# Patient Record
Sex: Female | Born: 1980 | Race: White | Hispanic: No | Marital: Single | Smoking: Former smoker
Health system: Southern US, Community
[De-identification: ages and names within clinical notes are randomized; demographics above are authoritative.]

## PROBLEM LIST (undated history)

## (undated) HISTORY — PX: OOPHORECTOMY: SHX6387

## (undated) HISTORY — PX: KNEE SURGERY: SHX244

---

## 2008-07-04 ENCOUNTER — Ambulatory Visit: Payer: Self-pay | Admitting: Family Medicine

## 2009-01-17 ENCOUNTER — Ambulatory Visit: Payer: Self-pay

## 2009-07-09 ENCOUNTER — Observation Stay: Payer: Self-pay

## 2009-09-10 ENCOUNTER — Observation Stay: Payer: Self-pay | Admitting: Obstetrics and Gynecology

## 2009-09-13 ENCOUNTER — Inpatient Hospital Stay: Payer: Self-pay | Admitting: Obstetrics and Gynecology

## 2009-11-16 ENCOUNTER — Ambulatory Visit: Payer: Self-pay | Admitting: Obstetrics & Gynecology

## 2009-11-28 ENCOUNTER — Emergency Department: Payer: Self-pay | Admitting: Emergency Medicine

## 2009-12-07 ENCOUNTER — Ambulatory Visit: Payer: Self-pay | Admitting: Obstetrics & Gynecology

## 2011-07-01 ENCOUNTER — Emergency Department: Payer: Self-pay | Admitting: Emergency Medicine

## 2012-06-17 ENCOUNTER — Ambulatory Visit: Payer: Self-pay | Admitting: Obstetrics & Gynecology

## 2012-06-17 LAB — CBC WITH DIFFERENTIAL/PLATELET
Basophil %: 0.3 %
Eosinophil %: 2 %
HCT: 32.3 % — ABNORMAL LOW (ref 35.0–47.0)
Lymphocyte %: 18.1 %
MCHC: 33.6 g/dL (ref 32.0–36.0)
Monocyte %: 6.3 %
Neutrophil #: 8.4 10*3/uL — ABNORMAL HIGH (ref 1.4–6.5)
Platelet: 272 10*3/uL (ref 150–440)
WBC: 11.4 10*3/uL — ABNORMAL HIGH (ref 3.6–11.0)

## 2012-06-18 ENCOUNTER — Inpatient Hospital Stay: Payer: Self-pay | Admitting: Obstetrics & Gynecology

## 2012-07-27 LAB — PATHOLOGY REPORT

## 2014-05-19 ENCOUNTER — Emergency Department: Payer: Self-pay | Admitting: Emergency Medicine

## 2014-05-21 ENCOUNTER — Encounter (HOSPITAL_COMMUNITY): Payer: Self-pay | Admitting: Emergency Medicine

## 2014-05-21 ENCOUNTER — Emergency Department (HOSPITAL_COMMUNITY)
Admission: EM | Admit: 2014-05-21 | Discharge: 2014-05-21 | Disposition: A | Discharge status: Home / Self Care | Payer: No Typology Code available for payment source | Attending: Emergency Medicine | Admitting: Emergency Medicine

## 2014-05-21 ENCOUNTER — Emergency Department (HOSPITAL_COMMUNITY): Payer: No Typology Code available for payment source

## 2014-05-21 DIAGNOSIS — Z9889 Other specified postprocedural states: Secondary | ICD-10-CM | POA: Diagnosis not present

## 2014-05-21 DIAGNOSIS — M791 Myalgia, unspecified site: Secondary | ICD-10-CM

## 2014-05-21 DIAGNOSIS — M25519 Pain in unspecified shoulder: Secondary | ICD-10-CM | POA: Diagnosis not present

## 2014-05-21 DIAGNOSIS — M25569 Pain in unspecified knee: Secondary | ICD-10-CM | POA: Insufficient documentation

## 2014-05-21 DIAGNOSIS — G8911 Acute pain due to trauma: Secondary | ICD-10-CM | POA: Insufficient documentation

## 2014-05-21 DIAGNOSIS — T148XXA Other injury of unspecified body region, initial encounter: Secondary | ICD-10-CM

## 2014-05-21 DIAGNOSIS — M542 Cervicalgia: Secondary | ICD-10-CM | POA: Insufficient documentation

## 2014-05-21 MED ORDER — OXYCODONE-ACETAMINOPHEN 5-325 MG PO TABS: 1.0000 | ORAL_TABLET | ORAL | Status: DC | PRN

## 2014-05-21 MED ORDER — OXYCODONE-ACETAMINOPHEN 5-325 MG PO TABS
2.0000 | ORAL_TABLET | Freq: Once | ORAL | Status: AC
  Administered 2014-05-21: 2 via ORAL
  Filled 2014-05-21: qty 2
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
## 2014-05-21 NOTE — ED Notes (Signed)
Pt reports a knot to collar bone area with limited movement  Rt arm. Pt unable to lift Rt arm above shoulder level.
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
## 2014-05-24 NOTE — ED Provider Notes (Signed)
Medical screening examination/treatment/procedure(s) were performed by non-physician practitioner and as supervising physician I was immediately available for consultation/collaboration.   EKG Interpretation None       Jaedyn Marrufo, MD 05/24/14 1117 

## 2015-01-03 NOTE — Op Note (Signed)
PATIENT NAME:  Misty Lyons, Misty Lyons MR#:  [id number] DATE OF BIRTH:  Apr 13, 1981  DATE OF PROCEDURE:  06/18/2012  PREOPERATIVE DIAGNOSES:  1. Term intrauterine pregnancy, prior cesarean section.  2. Desire for permanent sterility.   POSTOPERATIVE DIAGNOSES:   1. Term intrauterine pregnancy, prior cesarean section.  2. Desire for permanent sterility.   PROCEDURE PERFORMED:  1. Low transverse cesarean section. 2. Bilateral tubal ligation.   SURGEON: Dierdre Searles, MD   ASSISTANT: Elliot Gurney, MD   ANESTHESIA: Spinal.   ESTIMATED BLOOD LOSS: 250 mL.   COMPLICATIONS: None.   FINDINGS: Normal tubes, ovaries, and uterus with minimal adhesions. The patient has delivery of a viable female infant, named Arnetha Massy, with a weight of 7 pounds, 13 ounces and Apgar scores of 9 and 9 at one and five minutes, respectively.   DISPOSITION: To the recovery room in stable condition.   TECHNIQUE: The patient is prepped and draped in the usual sterile fashion after adequate anesthesia is obtained in the supine position on the Operating Room table. A scalpel was used to create a low transverse skin incision through the area of a prior scar down to the level of the rectus fascia. The rectus fascia is then dissected bilaterally using Mayo scissors. The rectus muscles are separated from the rectus fascia as well as the midline. The peritoneum is penetrated and the serosa over the lower uterine segment is dissected with inferior retraction of the bladder. A scalpel is then used to create a low transverse hysterotomy incision that is then extended by blunt dissection. Amniotomy then reveals clear fluid. The infant's head is delivered without the use of a suction device, and the oropharynx is suctioned. The remaining portion of the infant is delivered with clamping and cutting of the umbilical cord.   Umbilical blood is obtained and the placenta is then manually extracted. The uterus is externalized and cleansed of all  membranes and debris using a moist sponge. The hysterotomy incision is closed with a running 1 Vicryl suture in a locking fashion with excellent hemostasis and closure noted.   The right fallopian tube is grasped in the midportion with a Babcock clamp, and a loop is tied with 2 Vicryl sutures, excised and cauterized. There is no ovary observed on the right side. The left fallopian tube is grasped in the midportion with a Babcock clamp, and a loop is tied with 2 Vicryl sutures, excised, and cauterized. The left ovary is normal on this side.   The uterus is then placed back in the intra-abdominal cavity, and the paracolic colic gutters are irrigated with warm saline. Re-examination of the tubal incisions and the hysterotomy incision reveals excellent hemostasis. Interceed is placed over the uterine incision. The peritoneum is closed with a Vicryl suture.   The On-Q pain pump device is placed. A Kocher is used to attempt to pull up on the rectus fascia. Two trocars were placed through the abdomen into the subfascial space, and then the Silver soaker catheters are threaded into place through these trocars into the subfascial space. The fascia is closed with 0 Maxon suture with careful placement of suture not to incorporate the catheters. Subcutaneous tissues are irrigated and hemostasis is assured using electrocautery. The skin is closed with dissolvable staples and Dermabond. The catheters are flushed with 5 mL each of bupivacaine 0.5%. The catheters are stabilized in place with Steri-Strips and a Tegaderm bandage. The patient goes to the recovery room in stable condition. All sponge, instrument, and needle  counts are correct. ____________________________ R. Annamarie MajorPaul Maryse Brierley, MD rph:cbb D: 06/18/2012 10:10:16 ET T: 06/18/2012 10:46:23 ET JOB#: [id number]  cc: Dierdre Searles. Paul Shaquna Geigle, MD, <Dictator> Nadara MustardOBERT P Rayen Dafoe MD ELECTRONICALLY SIGNED 06/23/2012 8:40
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
## 2015-11-30 ENCOUNTER — Emergency Department: Payer: Self-pay

## 2015-11-30 ENCOUNTER — Emergency Department
Admission: EM | Admit: 2015-11-30 | Discharge: 2015-11-30 | Disposition: A | Discharge status: Home / Self Care | Payer: Self-pay | Attending: Emergency Medicine | Admitting: Emergency Medicine

## 2015-11-30 ENCOUNTER — Encounter: Payer: Self-pay | Admitting: Emergency Medicine

## 2015-11-30 DIAGNOSIS — Y998 Other external cause status: Secondary | ICD-10-CM | POA: Insufficient documentation

## 2015-11-30 DIAGNOSIS — Y9289 Other specified places as the place of occurrence of the external cause: Secondary | ICD-10-CM | POA: Insufficient documentation

## 2015-11-30 DIAGNOSIS — S93401A Sprain of unspecified ligament of right ankle, initial encounter: Secondary | ICD-10-CM | POA: Insufficient documentation

## 2015-11-30 DIAGNOSIS — Y9389 Activity, other specified: Secondary | ICD-10-CM | POA: Insufficient documentation

## 2015-11-30 DIAGNOSIS — X501XXA Overexertion from prolonged static or awkward postures, initial encounter: Secondary | ICD-10-CM | POA: Insufficient documentation

## 2015-11-30 DIAGNOSIS — F17200 Nicotine dependence, unspecified, uncomplicated: Secondary | ICD-10-CM | POA: Insufficient documentation

## 2015-11-30 MED ORDER — HYDROCODONE-ACETAMINOPHEN 5-325 MG PO TABS: 1.0000 | ORAL_TABLET | ORAL | Status: DC | PRN

## 2015-11-30 MED ORDER — IBUPROFEN 800 MG PO TABS: 800.0000 mg | ORAL_TABLET | Freq: Three times a day (TID) | ORAL | Status: DC | PRN

## 2015-11-30 NOTE — Discharge Instructions (Signed)
Acute Ankle Sprain With Phase I Rehab An acute ankle sprain is a partial or complete tear in one or more of the ligaments of the ankle due to traumatic injury. The severity of the injury depends on both the number of ligaments sprained and the grade of sprain. There are 3 grades of sprains.   A grade 1 sprain is a mild sprain. There is a slight pull without obvious tearing. There is no loss of strength, and the muscle and ligament are the correct length.  A grade 2 sprain is a moderate sprain. There is tearing of fibers within the substance of the ligament where it connects two bones or two cartilages. The length of the ligament is increased, and there is usually decreased strength.  A grade 3 sprain is a complete rupture of the ligament and is uncommon. In addition to the grade of sprain, there are three types of ankle sprains.  Lateral ankle sprains: This is a sprain of one or more of the three ligaments on the outer side (lateral) of the ankle. These are the most common sprains. Medial ankle sprains: There is one large triangular ligament of the inner side (medial) of the ankle that is susceptible to injury. Medial ankle sprains are less common. Syndesmosis, "high ankle," sprains: The syndesmosis is the ligament that connects the two bones of the lower leg. Syndesmosis sprains usually only occur with very severe ankle sprains. SYMPTOMS  Pain, tenderness, and swelling in the ankle, starting at the side of injury that may progress to the whole ankle and foot with time.  "Pop" or tearing sensation at the time of injury.  Bruising that may spread to the heel.  Impaired ability to walk soon after injury. CAUSES   Acute ankle sprains are caused by trauma placed on the ankle that temporarily forces or pries the anklebone (talus) out of its normal socket.  Stretching or tearing of the ligaments that normally hold the joint in place (usually due to a twisting injury). RISK INCREASES  WITH:  Previous ankle sprain.  Sports in which the foot may land awkwardly (i.e., basketball, volleyball, or soccer) or walking or running on uneven or rough surfaces.  Shoes with inadequate support to prevent sideways motion when stress occurs.  Poor strength and flexibility.  Poor balance skills.  Contact sports. PREVENTION   Warm up and stretch properly before activity.  Maintain physical fitness:  Ankle and leg flexibility, muscle strength, and endurance.  Cardiovascular fitness.  Balance training activities.  Use proper technique and have a coach correct improper technique.  Taping, protective strapping, bracing, or high-top tennis shoes may help prevent injury. Initially, tape is best; however, it loses most of its support function within 10 to 15 minutes.  Wear proper-fitted protective shoes (High-top shoes with taping or bracing is more effective than either alone).  Provide the ankle with support during sports and practice activities for 12 months following injury. PROGNOSIS   If treated properly, ankle sprains can be expected to recover completely; however, the length of recovery depends on the degree of injury.  A grade 1 sprain usually heals enough in 5 to 7 days to allow modified activity and requires an average of 6 weeks to heal completely.  A grade 2 sprain requires 6 to 10 weeks to heal completely.  A grade 3 sprain requires 12 to 16 weeks to heal.  A syndesmosis sprain often takes more than 3 months to heal. RELATED COMPLICATIONS   Frequent recurrence of symptoms may   result in a chronic problem. Appropriately addressing the problem the first time decreases the frequency of recurrence and optimizes healing time. Severity of the initial sprain does not predict the likelihood of later instability. °· Injury to other structures (bone, cartilage, or tendon). °· A chronically unstable or arthritic ankle joint is a possibility with repeated  sprains. °TREATMENT °Treatment initially involves the use of ice, medication, and compression bandages to help reduce pain and inflammation. Ankle sprains are usually immobilized in a walking cast or boot to allow for healing. Crutches may be recommended to reduce pressure on the injury. After immobilization, strengthening and stretching exercises may be necessary to regain strength and a full range of motion. Surgery is rarely needed to treat ankle sprains. °MEDICATION  °· Nonsteroidal anti-inflammatory medications, such as aspirin and ibuprofen (do not take for the first 3 days after injury or within 7 days before surgery), or other minor pain relievers, such as acetaminophen, are often recommended. Take these as directed by your caregiver. Contact your caregiver immediately if any bleeding, stomach upset, or signs of an allergic reaction occur from these medications. °· Ointments applied to the skin may be helpful. °· Pain relievers may be prescribed as necessary by your caregiver. Do not take prescription pain medication for longer than 4 to 7 days. Use only as directed and only as much as you need. °HEAT AND COLD °· Cold treatment (icing) is used to relieve pain and reduce inflammation for acute and chronic cases. Cold should be applied for 10 to 15 minutes every 2 to 3 hours for inflammation and pain and immediately after any activity that aggravates your symptoms. Use ice packs or an ice massage. °· Heat treatment may be used before performing stretching and strengthening activities prescribed by your caregiver. Use a heat pack or a warm soak. °SEEK IMMEDIATE MEDICAL CARE IF:  °· Pain, swelling, or bruising worsens despite treatment. °· You experience pain, numbness, discoloration, or coldness in the foot or toes. °· New, unexplained symptoms develop (drugs used in treatment may produce side effects.) °EXERCISES  °PHASE I EXERCISES °RANGE OF MOTION (ROM) AND STRETCHING EXERCISES - Ankle Sprain, Acute Phase I,  Weeks 1 to 2 °These exercises may help you when beginning to restore flexibility in your ankle. You will likely work on these exercises for the 1 to 2 weeks after your injury. Once your physician, physical therapist, or athletic trainer sees adequate progress, he or she will advance your exercises. While completing these exercises, remember:  °· Restoring tissue flexibility helps normal motion to return to the joints. This allows healthier, less painful movement and activity. °· An effective stretch should be held for at least 30 seconds. °· A stretch should never be painful. You should only feel a gentle lengthening or release in the stretched tissue. °RANGE OF MOTION - Dorsi/Plantar Flexion °· While sitting with your right / left knee straight, draw the top of your foot upwards by flexing your ankle. Then reverse the motion, pointing your toes downward. °· Hold each position for __________ seconds. °· After completing your first set of exercises, repeat this exercise with your knee bent. °Repeat __________ times. Complete this exercise __________ times per day.  °RANGE OF MOTION - Ankle Alphabet °· Imagine your right / left big toe is a pen. °· Keeping your hip and knee still, write out the entire alphabet with your "pen." Make the letters as large as you can without increasing any discomfort. °Repeat __________ times. Complete this exercise __________   times per day.  °STRENGTHENING EXERCISES - Ankle Sprain, Acute -Phase I, Weeks 1 to 2 °These exercises may help you when beginning to restore strength in your ankle. You will likely work on these exercises for 1 to 2 weeks after your injury. Once your physician, physical therapist, or athletic trainer sees adequate progress, he or she will advance your exercises. While completing these exercises, remember:  °· Muscles can gain both the endurance and the strength needed for everyday activities through controlled exercises. °· Complete these exercises as instructed by  your physician, physical therapist, or athletic trainer. Progress the resistance and repetitions only as guided. °· You may experience muscle soreness or fatigue, but the pain or discomfort you are trying to eliminate should never worsen during these exercises. If this pain does worsen, stop and make certain you are following the directions exactly. If the pain is still present after adjustments, discontinue the exercise until you can discuss the trouble with your clinician. °STRENGTH - Dorsiflexors °· Secure a rubber exercise band/tubing to a fixed object (i.e., table, pole) and loop the other end around your right / left foot. °· Sit on the floor facing the fixed object. The band/tubing should be slightly tense when your foot is relaxed. °· Slowly draw your foot back toward you using your ankle and toes. °· Hold this position for __________ seconds. Slowly release the tension in the band and return your foot to the starting position. °Repeat __________ times. Complete this exercise __________ times per day.  °STRENGTH - Plantar-flexors  °· Sit with your right / left leg extended. Holding onto both ends of a rubber exercise band/tubing, loop it around the ball of your foot. Keep a slight tension in the band. °· Slowly push your toes away from you, pointing them downward. °· Hold this position for __________ seconds. Return slowly, controlling the tension in the band/tubing. °Repeat __________ times. Complete this exercise __________ times per day.  °STRENGTH - Ankle Eversion °· Secure one end of a rubber exercise band/tubing to a fixed object (table, pole). Loop the other end around your foot just before your toes. °· Place your fists between your knees. This will focus your strengthening at your ankle. °· Drawing the band/tubing across your opposite foot, slowly, pull your little toe out and up. Make sure the band/tubing is positioned to resist the entire motion. °· Hold this position for __________ seconds. °Have  your muscles resist the band/tubing as it slowly pulls your foot back to the starting position.  °Repeat __________ times. Complete this exercise __________ times per day.  °STRENGTH - Ankle Inversion °· Secure one end of a rubber exercise band/tubing to a fixed object (table, pole). Loop the other end around your foot just before your toes. °· Place your fists between your knees. This will focus your strengthening at your ankle. °· Slowly, pull your big toe up and in, making sure the band/tubing is positioned to resist the entire motion. °· Hold this position for __________ seconds. °· Have your muscles resist the band/tubing as it slowly pulls your foot back to the starting position. °Repeat __________ times. Complete this exercises __________ times per day.  °STRENGTH - Towel Curls °· Sit in a chair positioned on a non-carpeted surface. °· Place your right / left foot on a towel, keeping your heel on the floor. °· Pull the towel toward your heel by only curling your toes. Keep your heel on the floor. °· If instructed by your physician, physical therapist,   or athletic trainer, add weight to the end of the towel. Repeat __________ times. Complete this exercise __________ times per day.   This information is not intended to replace advice given to you by your health care provider. Make sure you discuss any questions you have with your health care provider.   Document Released: 04/03/2005 Document Revised: 09/23/2014 Document Reviewed: 12/15/2008 Elsevier Interactive Patient Education 2016 [employer]   Continue Ace wrap for support. Use ibuprofen and hydrocodone as needed for pain control. Follow-up with the orthopedist if not improving. Begin exercises as pain improves.

## 2015-11-30 NOTE — ED Notes (Signed)
States she twisted her right ankle on Monday ..swelling with bruising noted to lateral aspect of ankle

## 2015-11-30 NOTE — ED Notes (Signed)
States while walking Monday morning patient "rolled right ankle over".  C/O worsening pain and swelling since injury.

## 2015-11-30 NOTE — ED Provider Notes (Signed)
[facility] Emergency Department Provider Note  ____________________________________________  Time seen: Approximately 3:38 PM  I have reviewed the triage vital signs and the nursing notes.   HISTORY  Chief Complaint Ankle Pain    HPI Misty Lyons is a 35 y.o. female who injured her ankle about 4 days ago. She has a history of ankle sprain. She is able to ambulate. She's had swelling and bruising.   History reviewed. No pertinent past medical history.  There are no active problems to display for this patient.   Past Surgical History  Procedure Laterality Date  . Knee surgery      RT Knee  . Cesarean section       HX of 2 sections  . Oophorectomy      Current Outpatient Rx  Name  Route  Sig  Dispense  Refill  . HYDROcodone-acetaminophen (NORCO) 5-325 MG tablet   Oral   Take 1 tablet by mouth every 4 (four) hours as needed for moderate pain.   12 tablet   0   . ibuprofen (ADVIL,MOTRIN) 800 MG tablet   Oral   Take 1 tablet (800 mg total) by mouth every 8 (eight) hours as needed.   15 tablet   0   . oxyCODONE-acetaminophen (PERCOCET/ROXICET) 5-325 MG per tablet   Oral   Take 1-2 tablets by mouth every 4 (four) hours as needed for severe pain.   15 tablet   0     Allergies Sulfa antibiotics  No family history on file.  Social History Social History  Substance Use Topics  . Smoking status: Current Some Day Smoker -- 0.50 packs/day  . Smokeless tobacco: Never Used  . Alcohol Use: Yes     Comment: social    Review of Systems Constitutional: No fever/chills Eyes: No visual changes. ENT: No sore throat. Cardiovascular: Denies chest pain. Respiratory: Denies shortness of breath. Gastrointestinal: No abdominal pain.  No nausea, no vomiting.  No diarrhea.  No constipation. Genitourinary: Negative for dysuria. Musculoskeletal: Negative for back pain. Skin: Negative for rash. Neurological: Negative for headaches, focal weakness  or numbness. 10-point ROS otherwise negative.  ____________________________________________   PHYSICAL EXAM:  VITAL SIGNS: ED Triage Vitals  Enc Vitals Group     BP 11/30/15 1126 123/60 mmHg     Pulse Rate 11/30/15 1126 85     Resp 11/30/15 1126 16     Temp 11/30/15 1126 98.1 F (36.7 C)     Temp Source 11/30/15 1126 Oral     SpO2 --      Weight 11/30/15 1126 230 lb (104.327 kg)     Height 11/30/15 1126 5\' 4"  (1.626 m)     Head Cir --      Peak Flow --      Pain Score 11/30/15 1126 6     Pain Loc --      Pain Edu? --      Excl. in GC? --     Constitutional: Alert and oriented. Well appearing and in no acute distress. Eyes: Conjunctivae are normal. PERRL. EOMI. Neck:  Supple.  No adenopathy.   Cardiovascular: Normal rate, regular rhythm. Grossly normal heart sounds.  Respiratory: Normal respiratory effort.  No retractions. Lungs CTAB. Marland Kitchen. Musculoskeletal: Nml ROM of upper and lower extremity joints.Right ankle and tender over the lateral malleoli and surrounding ligaments with ecchymosis and swelling. Minimal laxity. Neurologic:  Normal speech and language. No gross focal neurologic deficits are appreciated. No gait instability. Skin:  Skin  is warm, dry and intact. No rash noted. Psychiatric: Mood and affect are normal. Speech and behavior are normal.  ____________________________________________   LABS (all labs ordered are listed, but only abnormal results are displayed)  Labs Reviewed - No data to display ____________________________________________  EKG   ____________________________________________  RADIOLOGY  CLINICAL DATA: Pain following twisting injury 3 days prior  EXAM: RIGHT ANKLE - COMPLETE 3+ VIEW  COMPARISON: None.  FINDINGS: Frontal, oblique, and lateral views were obtained. There is soft tissue swelling laterally. There is no demonstrable fracture or joint effusion. The ankle mortise appears intact. No appreciable joint space  narrowing.  IMPRESSION: Soft tissue swelling laterally. No fracture evident. Mortise appears intact.   Electronically Signed  By: Bretta Bang III M.D.  On: 11/30/2015 12:33 ____________________________________________   PROCEDURES  Procedure(s) performed: None  Critical Care performed: No  ____________________________________________   INITIAL IMPRESSION / ASSESSMENT AND PLAN / ED COURSE  Pertinent labs & imaging results that were available during my care of the patient were reviewed by me and considered in my medical decision making (see chart for details).  35 year old female with history of ankle sprains who presents with new right ankle sprain. X-ray without fracture. Able to ambulate without crutches. Ace wrap. Continue ice. Given ibuprofen. Hydrocodone if needed. Can follow-up with orthopedics if not improving. Given exercises to begin as pain improves. ____________________________________________   FINAL CLINICAL IMPRESSION(S) / ED DIAGNOSES  Final diagnoses:  Ankle sprain, right, initial encounter      Ignacia Bayley, PA-C 11/30/15 1606  Myrna Blazer, MD 12/01/15 [phone]

## 2022-05-08 ENCOUNTER — Encounter: Payer: Self-pay | Admitting: Nurse Practitioner

## 2022-05-08 ENCOUNTER — Ambulatory Visit (INDEPENDENT_AMBULATORY_CARE_PROVIDER_SITE_OTHER): Payer: 59 | Admitting: Nurse Practitioner

## 2022-05-08 ENCOUNTER — Other Ambulatory Visit: Payer: Self-pay

## 2022-05-08 VITALS — BP 122/84 | HR 88 | Temp 98.3°F | Resp 18 | Ht 63.0 in | Wt 260.4 lb

## 2022-05-08 DIAGNOSIS — Z131 Encounter for screening for diabetes mellitus: Secondary | ICD-10-CM

## 2022-05-08 DIAGNOSIS — Z1159 Encounter for screening for other viral diseases: Secondary | ICD-10-CM

## 2022-05-08 DIAGNOSIS — Z7689 Persons encountering health services in other specified circumstances: Secondary | ICD-10-CM

## 2022-05-08 DIAGNOSIS — Z114 Encounter for screening for human immunodeficiency virus [HIV]: Secondary | ICD-10-CM

## 2022-05-08 DIAGNOSIS — Z1231 Encounter for screening mammogram for malignant neoplasm of breast: Secondary | ICD-10-CM

## 2022-05-08 DIAGNOSIS — F411 Generalized anxiety disorder: Secondary | ICD-10-CM | POA: Diagnosis not present

## 2022-05-08 DIAGNOSIS — Z6841 Body Mass Index (BMI) 40.0 and over, adult: Secondary | ICD-10-CM | POA: Diagnosis not present

## 2022-05-08 DIAGNOSIS — Z13220 Encounter for screening for lipoid disorders: Secondary | ICD-10-CM | POA: Diagnosis not present

## 2022-05-08 DIAGNOSIS — R5383 Other fatigue: Secondary | ICD-10-CM

## 2022-05-08 DIAGNOSIS — R69 Illness, unspecified: Secondary | ICD-10-CM | POA: Diagnosis not present

## 2022-05-08 LAB — CBC WITH DIFFERENTIAL/PLATELET
Absolute Monocytes: 600 cells/uL (ref 200–950)
Basophils Absolute: 40 cells/uL (ref 0–200)
Basophils Relative: 0.5 %
Eosinophils Absolute: 277 cells/uL (ref 15–500)
Eosinophils Relative: 3.5 %
Hemoglobin: 12.8 g/dL (ref 11.7–15.5)
Lymphs Abs: 1785 cells/uL (ref 850–3900)
MCH: 28.8 pg (ref 27.0–33.0)
MCV: 85.8 fL (ref 80.0–100.0)
MPV: 9.4 fL (ref 7.5–12.5)
Neutrophils Relative %: 65.8 %
Platelets: 385 10*3/uL (ref 140–400)
RBC: 4.45 10*6/uL (ref 3.80–5.10)
Total Lymphocyte: 22.6 %

## 2022-05-08 MED ORDER — ESCITALOPRAM OXALATE 10 MG PO TABS: 10.0000 mg | ORAL_TABLET | Freq: Every day | ORAL | 0 refills | Status: DC

## 2022-05-08 NOTE — Assessment & Plan Note (Signed)
Patient's PHQ-9 and GAD scores are elevated.  Patient has been on an antidepressant in the past for grief.  She does not recall what the medication was she did not take it for an extended period time because she did not like that it made her feel like a zombie.  Discussed trialing Lexapro 10 mg daily.  Patient will follow-up in 4 weeks.

## 2022-05-08 NOTE — Progress Notes (Signed)
BP 122/84   Pulse 88   Temp 98.3 F (36.8 C) (Oral)   Resp 18   Ht 5\' 3"  (1.6 m)   Wt 260 lb 6.4 oz (118.1 kg)   LMP 04/25/2022   SpO2 98%   BMI 46.13 kg/m    Subjective:    Patient ID: 06/[id number], female    DOB: 11/01/1980, 41 y.o.   MRN: [id number]  HPI: Misty Lyons is a 41 y.o. female  Chief Complaint  Patient presents with   Establish Care   Obesity   Fatigue  Establish care: Her last physical was years ago.  Her last mammogram was never.  Her last Pap was many years ago.  She has no medical history.    Obesity: Her weight today is 216 lbs, with a BMI of 46.13.  Patient reports her highest weight was 216 lbs.  She says she has tried she goes to the gym 5 days a week, she says she has been watching her diet and has not lost any weight. She says she has been to [facility] she has done HCG, phentermine.  Fatigue: She says she has been feeling really fatigued lately. She says that she has been taking a bath at night which helps her relax to fall asleep.  If she does not her mind will not shut off and she does not get much sleep.   Anxiety: She says that she does have anxiety. She says she was on an antidepressant in 2008 but does not remember the name but did not like how it made her feel.  Discussed trying Lexapro.  PHQ9 score is positive she thinks it might be due to her weight gain.     05/08/2022    9:14 AM  Depression screen PHQ 2/9  Decreased Interest 0  Down, Depressed, Hopeless 0  PHQ - 2 Score 0  Altered sleeping 2  Tired, decreased energy 3  Change in appetite 0  Feeling bad or failure about yourself  2  Trouble concentrating 1  Moving slowly or fidgety/restless 0  Suicidal thoughts 0  PHQ-9 Score 8       05/08/2022    9:15 AM  GAD 7 : Generalized Anxiety Score  Nervous, Anxious, on Edge 1  Control/stop worrying 3  Worry too much - different things 3  Trouble relaxing 2  Restless 1  Easily annoyed or irritable 3  Afraid - awful might happen 1   Total GAD 7 Score 14  Anxiety Difficulty Somewhat difficult     Relevant past medical, surgical, family and social history reviewed and updated as indicated. Interim medical history since our last visit reviewed. Allergies and medications reviewed and updated.  Review of Systems  Constitutional: Negative for fever or weight change.  Positive for fatigue Respiratory: Negative for cough and shortness of breath.   Cardiovascular: Negative for chest pain or palpitations.  Gastrointestinal: Negative for abdominal pain, no bowel changes.  Musculoskeletal: Negative for gait problem or joint swelling.  Skin: Negative for rash.  Neurological: Negative for dizziness or headache.  No other specific complaints in a complete review of systems (except as listed in HPI above).      Objective:    BP 122/84   Pulse 88   Temp 98.3 F (36.8 C) (Oral)   Resp 18   Ht 5\' 3"  (1.6 m)   Wt 260 lb 6.4 oz (118.1 kg)   LMP 04/25/2022   SpO2 98%   BMI 46.13 kg/m  Wt Readings from Last 3 Encounters:  05/08/22 260 lb 6.4 oz (118.1 kg)  11/30/15 230 lb (104.3 kg)  05/21/14 225 lb (102.1 kg)    Physical Exam  Constitutional: Patient appears well-developed and well-nourished. Obese  No distress.  HEENT: head atraumatic, normocephalic, pupils equal and reactive to light, neck supple Cardiovascular: Normal rate, regular rhythm and normal heart sounds.  No murmur heard. No BLE edema. Pulmonary/Chest: Effort normal and breath sounds normal. No respiratory distress. Abdominal: Soft.  There is no tenderness. Psychiatric: Patient has a normal mood and affect. behavior is normal. Judgment and thought content normal.     Assessment & Plan:   Problem List Items Addressed This Visit       Other   GAD (generalized anxiety disorder)    Patient's PHQ-9 and GAD scores are elevated.  Patient has been on an antidepressant in the past for grief.  She does not recall what the medication was she did not take it  for an extended period time because she did not like that it made her feel like a zombie.  Discussed trialing Lexapro 10 mg daily.  Patient will follow-up in 4 weeks.      Relevant Medications   escitalopram (LEXAPRO) 10 MG tablet   Class 3 severe obesity due to excess calories without serious comorbidity with body mass index (BMI) of 45.0 to 49.9 in adult [facility]) - Primary    Patient's current weight is 260 pounds with a BMI of 46.13.  Patient states she has taken phentermine in the past and has also done hCG.  Patient states she has been working on watching her diet but has not lost any weight.  She says this is the heaviest she has ever been.  Discussed weight loss medications.  Also gave her information for [place] weight loss program.  We will discuss again at next appointment.      Relevant Orders   Lipid panel   CBC with Differential/Platelet   COMPLETE METABOLIC PANEL WITH GFR   Hemoglobin A1c   TSH   Other Visit Diagnoses     Other fatigue       Discussed this could be associated with obesity or her mental health.  We will also be getting lab work to check for underlying causes.   Relevant Orders   CBC with Differential/Platelet   COMPLETE METABOLIC PANEL WITH GFR   Hemoglobin A1c   TSH   VITAMIN D 25 Hydroxy (Vit-D Deficiency, Fractures)   Encounter to establish care       Schedule for CPE   Encounter for hepatitis C screening test for low risk patient       Relevant Orders   Hepatitis C antibody   Screening for HIV without presence of risk factors       Relevant Orders   HIV Antibody (routine testing w rflx)   Encounter for screening mammogram for malignant neoplasm of breast       Relevant Orders   MM 3D SCREEN BREAST BILATERAL   Screening for cholesterol level       Relevant Orders   Lipid panel   Screening for diabetes mellitus       Relevant Orders   COMPLETE METABOLIC PANEL WITH GFR   Hemoglobin A1c        Follow up plan: Return in about 4 weeks  (around 06/05/2022) for follow up.

## 2022-05-08 NOTE — Assessment & Plan Note (Signed)
Patient's current weight is 260 pounds with a BMI of 46.13.  Patient states she has taken phentermine in the past and has also done hCG.  Patient states she has been working on watching her diet but has not lost any weight.  She says this is the heaviest she has ever been.  Discussed weight loss medications.  Also gave her information for [place] weight loss program.  We will discuss again at next appointment.

## 2022-05-09 ENCOUNTER — Encounter: Payer: Self-pay | Admitting: Nurse Practitioner

## 2022-05-09 LAB — COMPLETE METABOLIC PANEL WITH GFR
AG Ratio: 1.3 (calc) (ref 1.0–2.5)
ALT: 11 U/L (ref 6–29)
AST: 15 U/L (ref 10–30)
Albumin: 4 g/dL (ref 3.6–5.1)
Alkaline phosphatase (APISO): 57 U/L (ref 31–125)
BUN: 16 mg/dL (ref 7–25)
CO2: 25 mmol/L (ref 20–32)
Calcium: 9.3 mg/dL (ref 8.6–10.2)
Chloride: 105 mmol/L (ref 98–110)
Creat: 0.76 mg/dL (ref 0.50–0.99)
Globulin: 3.2 g/dL (calc) (ref 1.9–3.7)
Glucose, Bld: 93 mg/dL (ref 65–99)
Potassium: 4.4 mmol/L (ref 3.5–5.3)
Sodium: 138 mmol/L (ref 135–146)
Total Bilirubin: 0.3 mg/dL (ref 0.2–1.2)
Total Protein: 7.2 g/dL (ref 6.1–8.1)
eGFR: 100 mL/min/{1.73_m2} (ref >=60)

## 2022-05-09 LAB — HIV ANTIBODY (ROUTINE TESTING W REFLEX): HIV 1&2 Ab, 4th Generation: NONREACTIVE

## 2022-05-09 LAB — VITAMIN D 25 HYDROXY (VIT D DEFICIENCY, FRACTURES): Vit D, 25-Hydroxy: 33 ng/mL (ref 30–100)

## 2022-05-09 LAB — HEPATITIS C ANTIBODY: Hepatitis C Ab: NONREACTIVE

## 2022-05-09 LAB — CBC WITH DIFFERENTIAL/PLATELET
HCT: 38.2 % (ref 35.0–45.0)
MCHC: 33.5 g/dL (ref 32.0–36.0)
Monocytes Relative: 7.6 %
Neutro Abs: 5198 cells/uL (ref 1500–7800)
RDW: 12.8 % (ref 11.0–15.0)
WBC: 7.9 10*3/uL (ref 3.8–10.8)

## 2022-05-09 LAB — LIPID PANEL
Cholesterol: 189 mg/dL (ref <200)
HDL: 45 mg/dL — ABNORMAL LOW (ref >=50)
LDL Cholesterol (Calc): 122 mg/dL (calc) — ABNORMAL HIGH
Non-HDL Cholesterol (Calc): 144 mg/dL (calc) — ABNORMAL HIGH (ref <130)
Total CHOL/HDL Ratio: 4.2 (calc) (ref <5.0)
Triglycerides: 109 mg/dL (ref <150)

## 2022-05-09 LAB — TSH: TSH: 1.4 mIU/L

## 2022-05-09 LAB — HEMOGLOBIN A1C
Hgb A1c MFr Bld: 5.2 % of total Hgb (ref <5.7)
Mean Plasma Glucose: 103 mg/dL
eAG (mmol/L): 5.7 mmol/L

## 2022-06-04 ENCOUNTER — Ambulatory Visit
Admission: RE | Admit: 2022-06-04 | Discharge: 2022-06-04 | Disposition: A | Discharge status: Home / Self Care | Payer: 59 | Source: Ambulatory Visit | Attending: Nurse Practitioner | Admitting: Nurse Practitioner

## 2022-06-04 DIAGNOSIS — Z1231 Encounter for screening mammogram for malignant neoplasm of breast: Secondary | ICD-10-CM | POA: Diagnosis not present

## 2022-06-05 ENCOUNTER — Encounter (INDEPENDENT_AMBULATORY_CARE_PROVIDER_SITE_OTHER): Payer: Self-pay

## 2022-06-05 ENCOUNTER — Encounter: Payer: Self-pay | Admitting: Nurse Practitioner

## 2022-06-05 ENCOUNTER — Ambulatory Visit (INDEPENDENT_AMBULATORY_CARE_PROVIDER_SITE_OTHER): Payer: 59 | Admitting: Nurse Practitioner

## 2022-06-05 VITALS — BP 118/84 | HR 91 | Temp 98.1°F | Resp 18 | Ht 63.0 in | Wt 258.7 lb

## 2022-06-05 DIAGNOSIS — Z6841 Body Mass Index (BMI) 40.0 and over, adult: Secondary | ICD-10-CM

## 2022-06-05 DIAGNOSIS — F411 Generalized anxiety disorder: Secondary | ICD-10-CM | POA: Diagnosis not present

## 2022-06-05 DIAGNOSIS — R69 Illness, unspecified: Secondary | ICD-10-CM | POA: Diagnosis not present

## 2022-06-05 MED ORDER — METFORMIN HCL 500 MG PO TABS: 500.0000 mg | ORAL_TABLET | Freq: Two times a day (BID) | ORAL | 0 refills | Status: DC

## 2022-06-05 MED ORDER — SERTRALINE HCL 25 MG PO TABS: 25.0000 mg | ORAL_TABLET | Freq: Every day | ORAL | 0 refills | Status: DC

## 2022-06-05 NOTE — Assessment & Plan Note (Signed)
Has been very concerned about her weight.  Lab work recently did not show any underlying cause.  Patient's insurance will not cover weight loss medication like Wegovy, Saxenda.  Due to patient's history of anxiety she is not a good candidate for phentermine or Contrave.  Patient states she has done some research and would like to try metformin.  Discussed side effects associated with metformin.  Also discussed that this would be off label use.  Patient still would like to try.  Prescription sent in.

## 2022-06-05 NOTE — Assessment & Plan Note (Signed)
Start taking Zoloft 25 mg daily.  Follow-up in 4 weeks.

## 2022-06-05 NOTE — Progress Notes (Signed)
BP 118/84   Pulse 91   Temp 98.1 F (36.7 C)   Resp 18   Ht 5\' 3"  (1.6 m)   Wt 258 lb 11.2 oz (117.3 kg)   LMP 04/25/2022   SpO2 98%   BMI 45.83 kg/m    Subjective:    Patient ID: Misty Lyons, female    DOB: [birth year], 41 y.o.   MRN: [id number]  HPI: Misty Lyons is a 41 y.o. female  Chief Complaint  Patient presents with   Follow-up   Anxiety   Obesity   Anxiety: Patient reports she has been struggling with anxiety.  She was previously on antidepressant in 2008 but does remember which one it was.  We started her on Lexapro 10 mg at last visit.  She reports that she took the lexapro for maybe a week and stopped because she felt like it was giving her a heart attack. Her PHQ9 and GAD scores continue to be elevated. Discussed options and she is willing to try zoloft. Will start zoloft 25 mg daily.  Follow up in 4weeks.     06/05/2022    3:34 PM 05/08/2022    9:14 AM  Depression screen PHQ 2/9  Decreased Interest 2 0  Down, Depressed, Hopeless 1 0  PHQ - 2 Score 3 0  Altered sleeping 1 2  Tired, decreased energy 3 3  Change in appetite 0 0  Feeling bad or failure about yourself  0 2  Trouble concentrating 1 1  Moving slowly or fidgety/restless 0 0  Suicidal thoughts 0 0  PHQ-9 Score 8 8  Difficult doing work/chores Somewhat difficult        06/05/2022    3:37 PM 05/08/2022    9:15 AM  GAD 7 : Generalized Anxiety Score  Nervous, Anxious, on Edge 2 1  Control/stop worrying 2 3  Worry too much - different things 1 3  Trouble relaxing 0 2  Restless 0 1  Easily annoyed or irritable 1 3  Afraid - awful might happen 0 1  Total GAD 7 Score 6 14  Anxiety Difficulty Somewhat difficult Somewhat difficult     Obesity:  Her current weight is 285 lbs with a BMI of 45.83. last appointment patient was given information for weight loss program with [facility]. She says she has called her insurance company and they will not cover weight loss medications like wegovy or saxenda.   Due to patients history of anxiety do not recommend contrave or phentermine.  Patient reports she has looked up metformin and would like to try that.  Discussed side effects of metformin.  Patient is aware that this is off label use.   Relevant past medical, surgical, family and social history reviewed and updated as indicated. Interim medical history since our last visit reviewed. Allergies and medications reviewed and updated.  Review of Systems  Constitutional: Negative for fever or weight change.  Positive for fatigue Respiratory: Negative for cough and shortness of breath.   Cardiovascular: Negative for chest pain or palpitations.  Gastrointestinal: Negative for abdominal pain, no bowel changes.  Musculoskeletal: Negative for gait problem or joint swelling.  Skin: Negative for rash.  Neurological: Negative for dizziness or headache.  No other specific complaints in a complete review of systems (except as listed in HPI above).      Objective:    BP 118/84   Pulse 91   Temp 98.1 F (36.7 C)   Resp 18   Ht 5\' 3"  (  1.6 m)   Wt 258 lb 11.2 oz (117.3 kg)   LMP 04/25/2022   SpO2 98%   BMI 45.83 kg/m   Wt Readings from Last 3 Encounters:  06/05/22 258 lb 11.2 oz (117.3 kg)  05/08/22 260 lb 6.4 oz (118.1 kg)  11/30/15 230 lb (104.3 kg)    Physical Exam  Constitutional: Patient appears well-developed and well-nourished. Obese  No distress.  HEENT: head atraumatic, normocephalic, pupils equal and reactive to light, neck supple Cardiovascular: Normal rate, regular rhythm and normal heart sounds.  No murmur heard. No BLE edema. Pulmonary/Chest: Effort normal and breath sounds normal. No respiratory distress. Abdominal: Soft.  There is no tenderness. Psychiatric: Patient has a normal mood and affect. behavior is normal. Judgment and thought content normal.     Assessment & Plan:   Problem List Items Addressed This Visit       Other   GAD (generalized anxiety disorder) -  Primary    Start taking Zoloft 25 mg daily.  Follow-up in 4 weeks.      Relevant Medications   sertraline (ZOLOFT) 25 MG tablet   Class 3 severe obesity due to excess calories without serious comorbidity with body mass index (BMI) of 45.0 to 49.9 in adult [facility])    Has been very concerned about her weight.  Lab work recently did not show any underlying cause.  Patient's insurance will not cover weight loss medication like Wegovy, Saxenda.  Due to patient's history of anxiety she is not a good candidate for phentermine or Contrave.  Patient states she has done some research and would like to try metformin.  Discussed side effects associated with metformin.  Also discussed that this would be off label use.  Patient still would like to try.  Prescription sent in.      Relevant Medications   metFORMIN (GLUCOPHAGE) 500 MG tablet     Follow up plan: Return in about 4 weeks (around 07/03/2022) for follow up.

## 2022-07-04 NOTE — Progress Notes (Signed)
BP 124/76   Pulse 85   Temp 98.7 F (37.1 C) (Oral)   Resp 16   Ht 5\' 3"  (1.6 m)   Wt 258 lb 12.8 oz (117.4 kg)   SpO2 98%   BMI 45.84 kg/m    Subjective:    Patient ID: , female    DOB: Apr 08, 1981, 41 y.o.   MRN: [id number]  HPI: Misty Lyons is a 41 y.o. female  Chief Complaint  Patient presents with   Anxiety    4 week follow up   Anxiety/depression/insomnia: Last office visit was 06/05/2022.  At that time she had discussed that she had been struggling with anxiety.  She had previously been on antidepressant in 2008 but was not sure which one it was.  We started her on Zoloft 25 mg daily. Her PHQ9 score has increased since last visit. Her GAD score has remained the same. She say she did not have any adverse effects. Discussed increasing dose and she is agreeable to that. Will increase zoloft to 50 mg daily. Patient also reports she has been having insomnia. She says she does take melatonin and that helps. She is going to continue taking melatonin at this time.        07/05/2022    9:16 AM 06/05/2022    3:34 PM 05/08/2022    9:14 AM  Depression screen PHQ 2/9  Decreased Interest 3 2 0  Down, Depressed, Hopeless 1 1 0  PHQ - 2 Score 4 3 0  Altered sleeping 3 1 2   Tired, decreased energy 3 3 3   Change in appetite 0 0 0  Feeling bad or failure about yourself  1 0 2  Trouble concentrating 0 1 1  Moving slowly or fidgety/restless 0 0 0  Suicidal thoughts 0 0 0  PHQ-9 Score 11 8 8   Difficult doing work/chores Somewhat difficult Somewhat difficult        07/05/2022    9:17 AM 06/05/2022    3:37 PM 05/08/2022    9:15 AM  GAD 7 : Generalized Anxiety Score  Nervous, Anxious, on Edge 1 2 1   Control/stop worrying 2 2 3   Worry too much - different things 2 1 3   Trouble relaxing 0 0 2  Restless 0 0 1  Easily annoyed or irritable 1 1 3   Afraid - awful might happen 0 0 1  Total GAD 7 Score 6 6 14   Anxiety Difficulty Somewhat difficult Somewhat difficult  Somewhat difficult     Obesity: Her last weight was 258 pounds on 06/05/2022.  last appointment patient was started on metformin 500 mg 2 times a day.  Today her weight is 258 lbs.  She reports she has not had any side effects.  She is going to continue taking metformin 500 mg 2 times a day.  Relevant past medical, surgical, family and social history reviewed and updated as indicated. Interim medical history since our last visit reviewed. Allergies and medications reviewed and updated.  Review of Systems  Constitutional: Negative for fever or weight change.  Positive for fatigue Respiratory: Negative for cough and shortness of breath.   Cardiovascular: Negative for chest pain or palpitations.  Gastrointestinal: Negative for abdominal pain, no bowel changes.  Musculoskeletal: Negative for gait problem or joint swelling.  Skin: Negative for rash.  Neurological: Negative for dizziness or headache.  No other specific complaints in a complete review of systems (except as listed in HPI above).      Objective:  BP 124/76   Pulse 85   Temp 98.7 F (37.1 C) (Oral)   Resp 16   Ht 5\' 3"  (1.6 m)   Wt 258 lb 12.8 oz (117.4 kg)   SpO2 98%   BMI 45.84 kg/m   Wt Readings from Last 3 Encounters:  07/05/22 258 lb 12.8 oz (117.4 kg)  06/05/22 258 lb 11.2 oz (117.3 kg)  05/08/22 260 lb 6.4 oz (118.1 kg)    Physical Exam  Constitutional: Patient appears well-developed and well-nourished. Obese  No distress.  HEENT: head atraumatic, normocephalic, pupils equal and reactive to light, neck supple Cardiovascular: Normal rate, regular rhythm and normal heart sounds.  No murmur heard. No BLE edema. Pulmonary/Chest: Effort normal and breath sounds normal. No respiratory distress. Abdominal: Soft.  There is no tenderness. Psychiatric: Patient has a normal mood and affect. behavior is normal. Judgment and thought content normal.     Assessment & Plan:   Problem List Items Addressed This Visit        Other   GAD (generalized anxiety disorder) - Primary    Increase  Zoloft to 50 mg daily.      Relevant Medications   sertraline (ZOLOFT) 50 MG tablet   Class 3 severe obesity due to excess calories without serious comorbidity with body mass index (BMI) of 45.0 to 49.9 in adult [facility])    Continue taking metformin 500 mg 2 times a day.  Continue to monitor portion control increasing physical activity as tolerated.      Mild episode of recurrent major depressive disorder (HCC)    Increase  Zoloft to 50 mg daily.      Relevant Medications   sertraline (ZOLOFT) 50 MG tablet   Insomnia due to other mental disorder    Continue using melatonin as needed.        Follow up plan: Return in about 4 weeks (around 08/02/2022) for follow up.

## 2022-07-05 ENCOUNTER — Encounter: Payer: Self-pay | Admitting: Nurse Practitioner

## 2022-07-05 ENCOUNTER — Ambulatory Visit: Payer: 59 | Admitting: Nurse Practitioner

## 2022-07-05 ENCOUNTER — Other Ambulatory Visit: Payer: Self-pay

## 2022-07-05 VITALS — BP 124/76 | HR 85 | Temp 98.7°F | Resp 16 | Ht 63.0 in | Wt 258.8 lb

## 2022-07-05 DIAGNOSIS — Z6841 Body Mass Index (BMI) 40.0 and over, adult: Secondary | ICD-10-CM

## 2022-07-05 DIAGNOSIS — F411 Generalized anxiety disorder: Secondary | ICD-10-CM | POA: Diagnosis not present

## 2022-07-05 DIAGNOSIS — F330 Major depressive disorder, recurrent, mild: Secondary | ICD-10-CM | POA: Diagnosis not present

## 2022-07-05 DIAGNOSIS — F5105 Insomnia due to other mental disorder: Secondary | ICD-10-CM

## 2022-07-05 DIAGNOSIS — F99 Mental disorder, not otherwise specified: Secondary | ICD-10-CM

## 2022-07-05 DIAGNOSIS — R69 Illness, unspecified: Secondary | ICD-10-CM | POA: Diagnosis not present

## 2022-07-05 MED ORDER — SERTRALINE HCL 50 MG PO TABS: 50.0000 mg | ORAL_TABLET | Freq: Every day | ORAL | 0 refills | Status: DC

## 2022-07-05 NOTE — Assessment & Plan Note (Signed)
Increase Zoloft to 50 mg daily.

## 2022-07-05 NOTE — Assessment & Plan Note (Signed)
Continue taking metformin 500 mg 2 times a day.  Continue to monitor portion control increasing physical activity as tolerated.

## 2022-07-05 NOTE — Assessment & Plan Note (Signed)
Continue using melatonin as needed.

## 2022-08-02 ENCOUNTER — Telehealth (INDEPENDENT_AMBULATORY_CARE_PROVIDER_SITE_OTHER): Payer: 59 | Admitting: Nurse Practitioner

## 2022-08-02 ENCOUNTER — Other Ambulatory Visit: Payer: Self-pay

## 2022-08-02 ENCOUNTER — Encounter: Payer: Self-pay | Admitting: Nurse Practitioner

## 2022-08-02 DIAGNOSIS — F411 Generalized anxiety disorder: Secondary | ICD-10-CM | POA: Diagnosis not present

## 2022-08-02 DIAGNOSIS — R69 Illness, unspecified: Secondary | ICD-10-CM | POA: Diagnosis not present

## 2022-08-02 DIAGNOSIS — F330 Major depressive disorder, recurrent, mild: Secondary | ICD-10-CM | POA: Diagnosis not present

## 2022-08-02 MED ORDER — DULOXETINE HCL 30 MG PO CPEP: 30.0000 mg | ORAL_CAPSULE | Freq: Every day | ORAL | 0 refills | Status: DC

## 2022-08-02 NOTE — Assessment & Plan Note (Signed)
Wean off zoloft, decrease dose to 25 mg daily for one week and then discontinue. Start cymbalta 30 mg daily.  Follow up in 4 weeks.  

## 2022-08-02 NOTE — Assessment & Plan Note (Signed)
Wean off zoloft, decrease dose to 25 mg daily for one week and then discontinue. Start cymbalta 30 mg daily.  Follow up in 4 weeks.

## 2022-08-02 NOTE — Progress Notes (Signed)
Name: Misty Lyons   MRN: [id number]    DOB: 05-Nov-1980   Date:08/02/2022       Progress Note  Subjective  Chief Complaint  Chief Complaint  Patient presents with   Anxiety    Follow up    I connected with  Kerrie Pleasure  on 08/02/22 at  9:00 AM EST by a video enabled telemedicine application and verified that I am speaking with the correct person using two identifiers.  I discussed the limitations of evaluation and management by telemedicine and the availability of in person appointments. The patient expressed understanding and agreed to proceed with a virtual visit  Staff also discussed with the patient that there may be a patient responsible charge related to this service. Patient Location: home Provider Location: [facility] Additional Individuals present: alone  HPI  Anxiety/depression: Patient is taking zoloft 50 mg daily. She says her mood has improved.  She says that she now has decreased sex drive.  She says she has also noticed that is making her hot and sweaty. Discussed tapering off zoloft and will trial cymbalta.  She is going to decrease zoloft to 25 mg daily for one week and then stop taking it.  She is going to start cymbalta and follow up in four weeks.      08/02/2022    8:53 AM 07/05/2022    9:16 AM 06/05/2022    3:34 PM 05/08/2022    9:14 AM  Depression screen PHQ 2/9  Decreased Interest 0 3 2 0  Down, Depressed, Hopeless 0 1 1 0  PHQ - 2 Score 0 4 3 0  Altered sleeping 0 3 1 2   Tired, decreased energy 2 3 3 3   Change in appetite 0 0 0 0  Feeling bad or failure about yourself  0 1 0 2  Trouble concentrating 2 0 1 1  Moving slowly or fidgety/restless 0 0 0 0  Suicidal thoughts 0 0 0 0  PHQ-9 Score 4 11 8 8   Difficult doing work/chores  Somewhat difficult Somewhat difficult        08/02/2022    8:54 AM 07/05/2022    9:17 AM 06/05/2022    3:37 PM 05/08/2022    9:15 AM  GAD 7 : Generalized Anxiety Score  Nervous, Anxious, on Edge 2 1 2 1   Control/stop  worrying 1 2 2 3   Worry too much - different things  2 1 3   Trouble relaxing 0 0 0 2  Restless 0 0 0 1  Easily annoyed or irritable 1 1 1 3   Afraid - awful might happen 0 0 0 1  Total GAD 7 Score  6 6 14   Anxiety Difficulty Somewhat difficult Somewhat difficult Somewhat difficult Somewhat difficult     Patient Active Problem List   Diagnosis Date Noted   Mild episode of recurrent major depressive disorder (HCC) 07/05/2022   Insomnia due to other mental disorder 07/05/2022   GAD (generalized anxiety disorder) 05/08/2022   Class 3 severe obesity due to excess calories without serious comorbidity with body mass index (BMI) of 45.0 to 49.9 in adult [facility]) 05/08/2022    Social History   Tobacco Use   Smoking status: Former    Types: Cigarettes    Quit date: 04/18/2021    Years since quitting: 1.2   Smokeless tobacco: Never  Substance Use Topics   Alcohol use: Yes    Comment: social     Current Outpatient Medications:    DULoxetine (  CYMBALTA) 30 MG capsule, Take 1 capsule (30 mg total) by mouth daily., Disp: 30 capsule, Rfl: 0  Allergies  Allergen Reactions   Sulfa Antibiotics Rash    I personally reviewed active problem list, medication list, allergies, notes from last encounter with the patient/caregiver today.  ROS  Constitutional: Negative for fever or weight change.  Respiratory: Negative for cough and shortness of breath.   Cardiovascular: Negative for chest pain or palpitations.  Gastrointestinal: Negative for abdominal pain, no bowel changes.  Musculoskeletal: Negative for gait problem or joint swelling.  Skin: Negative for rash.  Neurological: Negative for dizziness or headache.  No other specific complaints in a complete review of systems (except as listed in HPI above).   Objective  Virtual encounter, vitals not obtained.  There is no height or weight on file to calculate BMI.  Nursing Note and Vital Signs reviewed.  Physical Exam  Awake,alert and  oriented, speaking in complete sentences.   No results found for this or any previous visit (from the past 72 hour(s)).  Assessment & Plan  Problem List Items Addressed This Visit       Other   GAD (generalized anxiety disorder) - Primary    Wean off zoloft, decrease dose to 25 mg daily for one week and then discontinue. Start cymbalta 30 mg daily.  Follow up in 4 weeks.       Relevant Medications   DULoxetine (CYMBALTA) 30 MG capsule   Mild episode of recurrent major depressive disorder (HCC)    Wean off zoloft, decrease dose to 25 mg daily for one week and then discontinue. Start cymbalta 30 mg daily.  Follow up in 4 weeks.       Relevant Medications   DULoxetine (CYMBALTA) 30 MG capsule     -Red flags and when to present for emergency care or RTC including fever >101.38F, chest pain, shortness of breath, new/worsening/un-resolving symptoms,  reviewed with patient at time of visit. Follow up and care instructions discussed and provided in AVS. - I discussed the assessment and treatment plan with the patient. The patient was provided an opportunity to ask questions and all were answered. The patient agreed with the plan and demonstrated an understanding of the instructions.  I provided 20 minutes of non-face-to-face time during this encounter.  Berniece Salines, FNP

## 2022-09-02 ENCOUNTER — Encounter: Payer: Self-pay | Admitting: Nurse Practitioner

## 2022-09-03 ENCOUNTER — Other Ambulatory Visit: Payer: Self-pay

## 2022-09-03 ENCOUNTER — Ambulatory Visit (INDEPENDENT_AMBULATORY_CARE_PROVIDER_SITE_OTHER): Payer: 59 | Admitting: Nurse Practitioner

## 2022-09-03 ENCOUNTER — Encounter: Payer: Self-pay | Admitting: Nurse Practitioner

## 2022-09-03 VITALS — BP 112/84 | HR 90 | Temp 98.4°F | Resp 16 | Ht 63.0 in | Wt 260.8 lb

## 2022-09-03 DIAGNOSIS — F330 Major depressive disorder, recurrent, mild: Secondary | ICD-10-CM | POA: Diagnosis not present

## 2022-09-03 DIAGNOSIS — F5105 Insomnia due to other mental disorder: Secondary | ICD-10-CM | POA: Diagnosis not present

## 2022-09-03 DIAGNOSIS — F411 Generalized anxiety disorder: Secondary | ICD-10-CM

## 2022-09-03 DIAGNOSIS — R69 Illness, unspecified: Secondary | ICD-10-CM | POA: Diagnosis not present

## 2022-09-03 DIAGNOSIS — F99 Mental disorder, not otherwise specified: Secondary | ICD-10-CM | POA: Diagnosis not present

## 2022-09-03 MED ORDER — DULOXETINE HCL 30 MG PO CPEP: 30.0000 mg | ORAL_CAPSULE | Freq: Every day | ORAL | 1 refills | Status: DC

## 2022-09-03 NOTE — Progress Notes (Signed)
BP 112/84   Pulse 90   Temp 98.4 F (36.9 C) (Oral)   Resp 16   Ht _0  (1.6 m)   Wt 260 lb 12.8 oz (118.3 kg)   LMP 08/30/2022   SpO2 97%   BMI 46.20 kg/m    Subjective:    Patient ID: Misty Lyons, female    DOB: 28-Apr-1981, 41 y.o.   MRN: [id number]  HPI: Misty Lyons is a 41 y.o. female  Chief Complaint  Patient presents with   Anxiety    Follow up medication refills  Depression/anxiety/insomnia:  patient is currently taking cymbalta 30 mg daily.  Patient reports that her mood is much better. She says her sleep is better. Her PHQ9 and GAD scores have continued to improve. Will continue with current treatment.      09/03/2022    1:06 PM 08/02/2022    8:53 AM 07/05/2022    9:16 AM 06/05/2022    3:34 PM 05/08/2022    9:14 AM  Depression screen PHQ 2/9  Decreased Interest 0 0 3 2 0  Down, Depressed, Hopeless 0 0 1 1 0  PHQ - 2 Score 0 0 4 3 0  Altered sleeping 0 0 _1 Tired, decreased energy _2 Change in appetite 0 0 0 0 0  Feeling bad or failure about yourself  0 0 1 0 2  Trouble concentrating 1 2 0 1 1  Moving slowly or fidgety/restless 0 0 0 0 0  Suicidal thoughts 0 0 0 0 0  PHQ-9 Score _3 Difficult doing work/chores Not difficult at all  Somewhat difficult Somewhat difficult        09/03/2022    1:06 PM 08/02/2022    8:54 AM 07/05/2022    9:17 AM 06/05/2022    3:37 PM  GAD 7 : Generalized Anxiety Score  Nervous, Anxious, on Edge 0 _4 Control/stop worrying 0 _5 Worry too much - different things 0  2 1  Trouble relaxing 0 0 0 0  Restless 0 0 0 0  Easily annoyed or irritable _6 Afraid - awful might happen 0 0 0 0  Total GAD 7 Score _7 Anxiety Difficulty Not difficult at all Somewhat difficult Somewhat difficult Somewhat difficult      Relevant past medical, surgical, family and social history reviewed and updated as indicated. Interim medical history since our last visit reviewed. Allergies and medications  reviewed and updated.  Review of Systems Constitutional: Negative for fever or weight change.  Respiratory: Negative for cough and shortness of breath.   Cardiovascular: Negative for chest pain or palpitations.  Gastrointestinal: Negative for abdominal pain, no bowel changes.  Musculoskeletal: Negative for gait problem or joint swelling.  Skin: Negative for rash.  Neurological: Negative for dizziness or headache.  No other specific complaints in a complete review of systems (except as listed in HPI above).      Objective:    BP 112/84   Pulse 90   Temp 98.4 F (36.9 C) (Oral)   Resp 16   Ht _8  (1.6 m)   Wt 260 lb 12.8 oz (118.3 kg)   LMP 08/30/2022   SpO2 97%   BMI 46.20 kg/m   Wt Readings from Last 3 Encounters:  09/03/22 260 lb 12.8 oz (118.3 kg)  07/05/22 258 lb 12.8 oz (117.4 kg)  06/05/22 258 lb 11.2 oz (117.3 kg)    Physical Exam  Constitutional: Patient appears well-developed and well-nourished. Obese  No distress.  HEENT: head atraumatic, normocephalic, pupils equal and reactive to light, neck supple Cardiovascular: Normal rate, regular rhythm and normal heart sounds.  No murmur heard. No BLE edema. Pulmonary/Chest: Effort normal and breath sounds normal. No respiratory distress. Abdominal: Soft.  There is no tenderness. Psychiatric: Patient has a normal mood and affect. behavior is normal. Judgment and thought content normal.  Results for orders placed or performed in visit on 05/08/22  Lipid panel  Result Value Ref Range   Cholesterol 189 <200 mg/dL   HDL 45 (L) > OR = 50 mg/dL   Triglycerides 109 <150 mg/dL   LDL Cholesterol (Calc) 122 (H) mg/dL (calc)   Total CHOL/HDL Ratio 4.2 <5.0 (calc)   Non-HDL Cholesterol (Calc) 144 (H) <130 mg/dL (calc)  CBC with Differential/Platelet  Result Value Ref Range   WBC 7.9 3.8 - 10.8 Thousand/uL   RBC 4.45 3.80 - 5.10 Million/uL   Hemoglobin 12.8 11.7 - 15.5 g/dL   HCT 38.2 35.0 - 45.0 %   MCV 85.8 80.0 - 100.0  fL   MCH 28.8 27.0 - 33.0 pg   MCHC 33.5 32.0 - 36.0 g/dL   RDW 12.8 11.0 - 15.0 %   Platelets 385 140 - 400 Thousand/uL   MPV 9.4 7.5 - 12.5 fL   Neutro Abs 5,198 1,500 - 7,800 cells/uL   Lymphs Abs 1,785 850 - 3,900 cells/uL   Absolute Monocytes 600 200 - 950 cells/uL   Eosinophils Absolute 277 15 - 500 cells/uL   Basophils Absolute 40 0 - 200 cells/uL   Neutrophils Relative % 65.8 %   Total Lymphocyte 22.6 %   Monocytes Relative 7.6 %   Eosinophils Relative 3.5 %   Basophils Relative 0.5 %  COMPLETE METABOLIC PANEL WITH GFR  Result Value Ref Range   Glucose, Bld 93 65 - 99 mg/dL   BUN 16 7 - 25 mg/dL   Creat 0.76 0.50 - 0.99 mg/dL   eGFR 100 > OR = 60 mL/min/1.33m   BUN/Creatinine Ratio SEE NOTE: 6 - 22 (calc)   Sodium 138 135 - 146 mmol/L   Potassium 4.4 3.5 - 5.3 mmol/L   Chloride 105 98 - 110 mmol/L   CO2 25 20 - 32 mmol/L   Calcium 9.3 8.6 - 10.2 mg/dL   Total Protein 7.2 6.1 - 8.1 g/dL   Albumin 4.0 3.6 - 5.1 g/dL   Globulin 3.2 1.9 - 3.7 g/dL (calc)   AG Ratio 1.3 1.0 - 2.5 (calc)   Total Bilirubin 0.3 0.2 - 1.2 mg/dL   Alkaline phosphatase (APISO) 57 31 - 125 U/L   AST 15 10 - 30 U/L   ALT 11 6 - 29 U/L  Hemoglobin A1c  Result Value Ref Range   Hgb A1c MFr Bld 5.2 <5.7 % of total Hgb   Mean Plasma Glucose 103 mg/dL   eAG (mmol/L) 5.7 mmol/L  Hepatitis C antibody  Result Value Ref Range   Hepatitis C Ab NON-REACTIVE NON-REACTIVE  HIV Antibody (routine testing w rflx)  Result Value Ref Range   HIV 1&2 Ab, 4th Generation NON-REACTIVE NON-REACTIVE  TSH  Result Value Ref Range   TSH 1.40 mIU/L  VITAMIN D 25 Hydroxy (Vit-D Deficiency, Fractures)  Result Value Ref Range   Vit D, 25-Hydroxy 33 30 - 100 ng/mL      Assessment & Plan:   Problem  List Items Addressed This Visit       Other   GAD (generalized anxiety disorder) - Primary    Mood has improved continue Cymbalta 30 mg daily.       Relevant Medications   DULoxetine (CYMBALTA) 30 MG capsule    Mild episode of recurrent major depressive disorder (HCC)    Mood has improved continue Cymbalta 30 mg daily.       Relevant Medications   DULoxetine (CYMBALTA) 30 MG capsule   Insomnia due to other mental disorder    Mood has improved continue Cymbalta 30 mg daily.         Follow up plan: Return in about 6 months (around 03/05/2023) for follow up.

## 2022-09-03 NOTE — Assessment & Plan Note (Signed)
Mood has improved continue Cymbalta 30 mg daily.  

## 2022-09-03 NOTE — Assessment & Plan Note (Signed)
Mood has improved continue Cymbalta 30 mg daily.

## 2022-12-11 ENCOUNTER — Ambulatory Visit: Payer: 59 | Admitting: Podiatry

## 2022-12-24 ENCOUNTER — Ambulatory Visit: Payer: 59 | Admitting: Nurse Practitioner

## 2022-12-24 ENCOUNTER — Ambulatory Visit
Admission: RE | Admit: 2022-12-24 | Discharge: 2022-12-24 | Disposition: A | Discharge status: Home / Self Care | Payer: 59 | Source: Ambulatory Visit | Attending: Nurse Practitioner | Admitting: Nurse Practitioner

## 2022-12-24 ENCOUNTER — Encounter: Payer: Self-pay | Admitting: Nurse Practitioner

## 2022-12-24 ENCOUNTER — Ambulatory Visit
Admission: RE | Admit: 2022-12-24 | Discharge: 2022-12-24 | Disposition: A | Discharge status: Home / Self Care | Payer: 59 | Attending: Nurse Practitioner | Admitting: Nurse Practitioner

## 2022-12-24 ENCOUNTER — Other Ambulatory Visit: Payer: Self-pay

## 2022-12-24 VITALS — BP 110/74 | HR 98 | Temp 97.8°F | Resp 18 | Ht 63.0 in | Wt 277.2 lb

## 2022-12-24 DIAGNOSIS — R0602 Shortness of breath: Secondary | ICD-10-CM | POA: Insufficient documentation

## 2022-12-24 DIAGNOSIS — R051 Acute cough: Secondary | ICD-10-CM

## 2022-12-24 DIAGNOSIS — J0140 Acute pansinusitis, unspecified: Secondary | ICD-10-CM | POA: Diagnosis not present

## 2022-12-24 MED ORDER — BENZONATATE 100 MG PO CAPS: 200.0000 mg | ORAL_CAPSULE | Freq: Two times a day (BID) | ORAL | 0 refills | Status: DC | PRN

## 2022-12-24 MED ORDER — HYDROCOD POLI-CHLORPHE POLI ER 10-8 MG/5ML PO SUER: 5.0000 mL | Freq: Two times a day (BID) | ORAL | 0 refills | Status: DC

## 2022-12-24 MED ORDER — ALBUTEROL SULFATE HFA 108 (90 BASE) MCG/ACT IN AERS: 1.0000 | INHALATION_SPRAY | Freq: Four times a day (QID) | RESPIRATORY_TRACT | 0 refills | Status: DC | PRN

## 2022-12-24 MED ORDER — PREDNISONE 10 MG (21) PO TBPK: ORAL_TABLET | ORAL | 0 refills | Status: DC

## 2022-12-24 MED ORDER — AZITHROMYCIN 250 MG PO TABS: ORAL_TABLET | ORAL | 0 refills | Status: AC

## 2022-12-24 NOTE — Progress Notes (Signed)
BP 110/74   Pulse 98   Temp 97.8 F (36.6 C) (Oral)   Resp 18   Ht 5\' 3"  (1.6 m)   Wt 277 lb 3.2 oz (125.7 kg)   LMP 12/09/2022   SpO2 98%   BMI 49.10 kg/m    Subjective:    Patient ID: Misty Lyons, female    DOB: [birth year], 42 y.o.   MRN: [id number]  HPI: Misty Lyons is a 42 y.o. female  Chief Complaint  Patient presents with   URI    Cough, congested, runny nose, rattling in chest for 10 days   Shortness of Breath   Sinusitis/shortness of breath: symptoms started 10 days ago. She reports she has cough, congestion, runny nose, rattling in her chest and shortness of breath.  She denies any fever.  She has tried various over the counter medications for her symptoms.  Her lung sounds have rhonchi specifically on the left side. Will start zpack and send for chest xray to rule out pneumonia.  Will also send in tussinex, tessalon perls, albuterol and steroid taper.   Relevant past medical, surgical, family and social history reviewed and updated as indicated. Interim medical history since our last visit reviewed. Allergies and medications reviewed and updated.  Review of Systems  Constitutional: Negative for fever or weight change.  HEENT: positive for nasal congestion, and drainage Respiratory:positive for cough and shortness of breath.   Cardiovascular: Negative for chest pain or palpitations.  Gastrointestinal: Negative for abdominal pain, no bowel changes.  Musculoskeletal: Negative for gait problem or joint swelling.  Skin: Negative for rash.  Neurological: Negative for dizziness or headache.  No other specific complaints in a complete review of systems (except as listed in HPI above).      Objective:    BP 110/74   Pulse 98   Temp 97.8 F (36.6 C) (Oral)   Resp 18   Ht 5\' 3"  (1.6 m)   Wt 277 lb 3.2 oz (125.7 kg)   LMP 12/09/2022   SpO2 98%   BMI 49.10 kg/m   Wt Readings from Last 3 Encounters:  12/24/22 277 lb 3.2 oz (125.7 kg)  09/03/22 260 lb 12.8  oz (118.3 kg)  07/05/22 258 lb 12.8 oz (117.4 kg)    Physical Exam  Constitutional: Patient appears well-developed and well-nourished. Obese  No distress.  HEENT: head atraumatic, normocephalic, pupils equal and reactive to light, ears TMs clear, neck supple, throat within normal limits Cardiovascular: Normal rate, regular rhythm and normal heart sounds.  No murmur heard. No BLE edema. Pulmonary/Chest: Effort normal and breath sounds rhonchi specifically left side. No respiratory distress. Abdominal: Soft.  There is no tenderness. Psychiatric: Patient has a normal mood and affect. behavior is normal. Judgment and thought content normal.  Results for orders placed or performed in visit on 05/08/22  Lipid panel  Result Value Ref Range   Cholesterol 189 <200 mg/dL   HDL 45 (L) > OR = 50 mg/dL   Triglycerides 884 <166 mg/dL   LDL Cholesterol (Calc) 122 (H) mg/dL (calc)   Total CHOL/HDL Ratio 4.2 <5.0 (calc)   Non-HDL Cholesterol (Calc) 144 (H) <130 mg/dL (calc)  CBC with Differential/Platelet  Result Value Ref Range   WBC 7.9 3.8 - 10.8 Thousand/uL   RBC 4.45 3.80 - 5.10 Million/uL   Hemoglobin 12.8 11.7 - 15.5 g/dL   HCT 06.3 01.6 - 01.0 %   MCV 85.8 80.0 - 100.0 fL   MCH 28.8 27.0 -  33.0 pg   MCHC 33.5 32.0 - 36.0 g/dL   RDW 16.112.8 09.611.0 - 04.515.0 %   Platelets 385 140 - 400 Thousand/uL   MPV 9.4 7.5 - 12.5 fL   Neutro Abs 5,198 1,500 - 7,800 cells/uL   Lymphs Abs 1,785 850 - 3,900 cells/uL   Absolute Monocytes 600 200 - 950 cells/uL   Eosinophils Absolute 277 15 - 500 cells/uL   Basophils Absolute 40 0 - 200 cells/uL   Neutrophils Relative % 65.8 %   Total Lymphocyte 22.6 %   Monocytes Relative 7.6 %   Eosinophils Relative 3.5 %   Basophils Relative 0.5 %  COMPLETE METABOLIC PANEL WITH GFR  Result Value Ref Range   Glucose, Bld 93 65 - 99 mg/dL   BUN 16 7 - 25 mg/dL   Creat 4.090.76 8.110.50 - 9.140.99 mg/dL   eGFR 782100 > OR = 60 NF/AOZ/3.08M5mL/min/1.73m2   BUN/Creatinine Ratio SEE NOTE: 6 - 22  (calc)   Sodium 138 135 - 146 mmol/L   Potassium 4.4 3.5 - 5.3 mmol/L   Chloride 105 98 - 110 mmol/L   CO2 25 20 - 32 mmol/L   Calcium 9.3 8.6 - 10.2 mg/dL   Total Protein 7.2 6.1 - 8.1 g/dL   Albumin 4.0 3.6 - 5.1 g/dL   Globulin 3.2 1.9 - 3.7 g/dL (calc)   AG Ratio 1.3 1.0 - 2.5 (calc)   Total Bilirubin 0.3 0.2 - 1.2 mg/dL   Alkaline phosphatase (APISO) 57 31 - 125 U/L   AST 15 10 - 30 U/L   ALT 11 6 - 29 U/L  Hemoglobin A1c  Result Value Ref Range   Hgb A1c MFr Bld 5.2 <5.7 % of total Hgb   Mean Plasma Glucose 103 mg/dL   eAG (mmol/L) 5.7 mmol/L  Hepatitis C antibody  Result Value Ref Range   Hepatitis C Ab NON-REACTIVE NON-REACTIVE  HIV Antibody (routine testing w rflx)  Result Value Ref Range   HIV 1&2 Ab, 4th Generation NON-REACTIVE NON-REACTIVE  TSH  Result Value Ref Range   TSH 1.40 mIU/L  VITAMIN D 25 Hydroxy (Vit-D Deficiency, Fractures)  Result Value Ref Range   Vit D, 25-Hydroxy 33 30 - 100 ng/mL      Assessment & Plan:   Problem List Items Addressed This Visit   None Visit Diagnoses     Acute non-recurrent pansinusitis    -  Primary   start steroid taper, tussinex, tessalon perls, azithromycin.  continue OTC medications   Relevant Medications   predniSONE (STERAPRED UNI-PAK 21 TAB) 10 MG (21) TBPK tablet   azithromycin (ZITHROMAX) 250 MG tablet   benzonatate (TESSALON) 100 MG capsule   chlorpheniramine-HYDROcodone (TUSSIONEX) 10-8 MG/5ML   Shortness of breath       start steroid taper, tussinex, tessalon perls, azithromycin. continue OTC medications   Relevant Medications   predniSONE (STERAPRED UNI-PAK 21 TAB) 10 MG (21) TBPK tablet   azithromycin (ZITHROMAX) 250 MG tablet   benzonatate (TESSALON) 100 MG capsule   chlorpheniramine-HYDROcodone (TUSSIONEX) 10-8 MG/5ML   albuterol (VENTOLIN HFA) 108 (90 Base) MCG/ACT inhaler   Other Relevant Orders   DG Chest 2 View   Acute cough       start steroid taper, tussinex, tessalon perls, azithromycin.  continue OTC medications   Relevant Medications   predniSONE (STERAPRED UNI-PAK 21 TAB) 10 MG (21) TBPK tablet   azithromycin (ZITHROMAX) 250 MG tablet   benzonatate (TESSALON) 100 MG capsule   chlorpheniramine-HYDROcodone (TUSSIONEX) 10-8 MG/5ML  albuterol (VENTOLIN HFA) 108 (90 Base) MCG/ACT inhaler   Other Relevant Orders   DG Chest 2 View        Follow up plan: Return if symptoms worsen or fail to improve.

## 2023-01-14 NOTE — Progress Notes (Unsigned)
LMP 12/09/2022    Subjective:    Patient ID: Misty Lyons, female    DOB: 26-Mar-1981, 42 y.o.   MRN: [id number]  HPI: Misty Lyons is a 42 y.o. female  No chief complaint on file.   Relevant past medical, surgical, family and social history reviewed and updated as indicated. Interim medical history since our last visit reviewed. Allergies and medications reviewed and updated.  Review of Systems  Constitutional: Negative for fever or weight change.  Respiratory: Negative for cough and shortness of breath.   Cardiovascular: Negative for chest pain or palpitations.  Gastrointestinal: Negative for abdominal pain, no bowel changes.  Musculoskeletal: Negative for gait problem or joint swelling.  Skin: Negative for rash.  Neurological: Negative for dizziness or headache.  No other specific complaints in a complete review of systems (except as listed in HPI above).      Objective:    LMP 12/09/2022   Wt Readings from Last 3 Encounters:  12/24/22 277 lb 3.2 oz (125.7 kg)  09/03/22 260 lb 12.8 oz (118.3 kg)  07/05/22 258 lb 12.8 oz (117.4 kg)    Physical Exam  Constitutional: Patient appears well-developed and well-nourished. Obese *** No distress.  HEENT: head atraumatic, normocephalic, pupils equal and reactive to light, ears ***, neck supple, throat within normal limits Cardiovascular: Normal rate, regular rhythm and normal heart sounds.  No murmur heard. No BLE edema. Pulmonary/Chest: Effort normal and breath sounds normal. No respiratory distress. Abdominal: Soft.  There is no tenderness. Psychiatric: Patient has a normal mood and affect. behavior is normal. Judgment and thought content normal.  Results for orders placed or performed in visit on 05/08/22  Lipid panel  Result Value Ref Range   Cholesterol 189 <200 mg/dL   HDL 45 (L) > OR = 50 mg/dL   Triglycerides 409 <811 mg/dL   LDL Cholesterol (Calc) 122 (H) mg/dL (calc)   Total CHOL/HDL Ratio 4.2 <5.0 (calc)    Non-HDL Cholesterol (Calc) 144 (H) <130 mg/dL (calc)  CBC with Differential/Platelet  Result Value Ref Range   WBC 7.9 3.8 - 10.8 Thousand/uL   RBC 4.45 3.80 - 5.10 Million/uL   Hemoglobin 12.8 11.7 - 15.5 g/dL   HCT 91.4 78.2 - 95.6 %   MCV 85.8 80.0 - 100.0 fL   MCH 28.8 27.0 - 33.0 pg   MCHC 33.5 32.0 - 36.0 g/dL   RDW 21.3 08.6 - 57.8 %   Platelets 385 140 - 400 Thousand/uL   MPV 9.4 7.5 - 12.5 fL   Neutro Abs 5,198 1,500 - 7,800 cells/uL   Lymphs Abs 1,785 850 - 3,900 cells/uL   Absolute Monocytes 600 200 - 950 cells/uL   Eosinophils Absolute 277 15 - 500 cells/uL   Basophils Absolute 40 0 - 200 cells/uL   Neutrophils Relative % 65.8 %   Total Lymphocyte 22.6 %   Monocytes Relative 7.6 %   Eosinophils Relative 3.5 %   Basophils Relative 0.5 %  COMPLETE METABOLIC PANEL WITH GFR  Result Value Ref Range   Glucose, Bld 93 65 - 99 mg/dL   BUN 16 7 - 25 mg/dL   Creat 4.69 6.29 - 5.28 mg/dL   eGFR 413 > OR = 60 KG/MWN/0.27O5   BUN/Creatinine Ratio SEE NOTE: 6 - 22 (calc)   Sodium 138 135 - 146 mmol/L   Potassium 4.4 3.5 - 5.3 mmol/L   Chloride 105 98 - 110 mmol/L   CO2 25 20 - 32 mmol/L   Calcium  9.3 8.6 - 10.2 mg/dL   Total Protein 7.2 6.1 - 8.1 g/dL   Albumin 4.0 3.6 - 5.1 g/dL   Globulin 3.2 1.9 - 3.7 g/dL (calc)   AG Ratio 1.3 1.0 - 2.5 (calc)   Total Bilirubin 0.3 0.2 - 1.2 mg/dL   Alkaline phosphatase (APISO) 57 31 - 125 U/L   AST 15 10 - 30 U/L   ALT 11 6 - 29 U/L  Hemoglobin A1c  Result Value Ref Range   Hgb A1c MFr Bld 5.2 <5.7 % of total Hgb   Mean Plasma Glucose 103 mg/dL   eAG (mmol/L) 5.7 mmol/L  Hepatitis C antibody  Result Value Ref Range   Hepatitis C Ab NON-REACTIVE NON-REACTIVE  HIV Antibody (routine testing w rflx)  Result Value Ref Range   HIV 1&2 Ab, 4th Generation NON-REACTIVE NON-REACTIVE  TSH  Result Value Ref Range   TSH 1.40 mIU/L  VITAMIN D 25 Hydroxy (Vit-D Deficiency, Fractures)  Result Value Ref Range   Vit D, 25-Hydroxy 33 30 -  100 ng/mL      Assessment & Plan:   Problem List Items Addressed This Visit   None    Follow up plan: No follow-ups on file.

## 2023-01-15 ENCOUNTER — Encounter: Payer: Self-pay | Admitting: Nurse Practitioner

## 2023-01-15 ENCOUNTER — Other Ambulatory Visit: Payer: Self-pay

## 2023-01-15 ENCOUNTER — Ambulatory Visit (INDEPENDENT_AMBULATORY_CARE_PROVIDER_SITE_OTHER): Payer: 59 | Admitting: Nurse Practitioner

## 2023-01-15 VITALS — BP 118/76 | HR 96 | Temp 98.0°F | Resp 18 | Ht 63.0 in | Wt 270.2 lb

## 2023-01-15 DIAGNOSIS — R051 Acute cough: Secondary | ICD-10-CM

## 2023-01-15 DIAGNOSIS — J0141 Acute recurrent pansinusitis: Secondary | ICD-10-CM | POA: Diagnosis not present

## 2023-01-15 MED ORDER — AMOXICILLIN-POT CLAVULANATE 875-125 MG PO TABS
1.0000 | ORAL_TABLET | Freq: Two times a day (BID) | ORAL | 0 refills | Status: DC
Start: 2023-01-15 — End: 2024-06-24

## 2023-01-15 MED ORDER — PREDNISONE 10 MG (21) PO TBPK
ORAL_TABLET | ORAL | 0 refills | Status: DC
Start: 2023-01-15 — End: 2023-07-17

## 2023-01-21 ENCOUNTER — Encounter: Payer: Self-pay | Admitting: Nurse Practitioner

## 2023-07-17 ENCOUNTER — Other Ambulatory Visit: Payer: Self-pay

## 2023-07-17 ENCOUNTER — Ambulatory Visit: Payer: 59 | Admitting: Nurse Practitioner

## 2023-07-17 ENCOUNTER — Encounter: Payer: Self-pay | Admitting: Nurse Practitioner

## 2023-07-17 VITALS — BP 126/84 | HR 83 | Temp 98.0°F | Resp 18 | Ht 63.0 in | Wt 260.9 lb

## 2023-07-17 DIAGNOSIS — J0141 Acute recurrent pansinusitis: Secondary | ICD-10-CM | POA: Diagnosis not present

## 2023-07-17 DIAGNOSIS — R051 Acute cough: Secondary | ICD-10-CM

## 2023-07-17 MED ORDER — METHYLPREDNISOLONE 4 MG PO TBPK
ORAL_TABLET | ORAL | 0 refills | Status: DC
Start: 2023-07-17 — End: 2024-06-24

## 2023-07-17 MED ORDER — AZITHROMYCIN 250 MG PO TABS
ORAL_TABLET | ORAL | 0 refills | Status: AC
Start: 2023-07-17 — End: 2023-07-22

## 2023-07-17 MED ORDER — HYDROCOD POLI-CHLORPHE POLI ER 10-8 MG/5ML PO SUER
5.0000 mL | Freq: Two times a day (BID) | ORAL | 0 refills | Status: DC | PRN
Start: 2023-07-17 — End: 2024-06-24

## 2023-07-17 MED ORDER — BENZONATATE 100 MG PO CAPS
200.0000 mg | ORAL_CAPSULE | Freq: Two times a day (BID) | ORAL | 0 refills | Status: DC | PRN
Start: 2023-07-17 — End: 2024-06-24

## 2023-07-17 NOTE — Progress Notes (Signed)
BP 126/84   Pulse 83   Temp 98 F (36.7 C) (Oral)   Resp 18   Ht 5\' 3"  (1.6 m)   Wt 260 lb 14.4 oz (118.3 kg)   SpO2 99%   BMI 46.22 kg/m    Subjective:    Patient ID: Misty Lyons, female    DOB: 1980-09-22, 42 y.o.   MRN: [id number]  HPI: Misty Lyons is a 42 y.o. female  Chief Complaint  Patient presents with   Sinusitis    Facial pressure for 2 weeks   Ear Pain   URI Compliant: symptoms started last week, got worse on Sunday -Fever: subjective fever -Cough: yes -Shortness of breath: yes -Wheezing: yes -Chest congestion: yes -Nasal congestion: yes -Runny nose: no -Post nasal drip: no -Sore throat: yes -Sinus pressure: yes -Headache: yes -Face pain: yes -Ear pain:  yes -Ear pressure: yes -Relief with OTC cold/cough medications: OTC treatments including mucinex nose spray has helped.    Recommend taking zyrtec, flonase, mucinex, vitamin d, vitamin c, and zinc. Push fluids and get rest.    Sending in phenergan-DM, tessalon perls, medrol dose pack and zpac.  Relevant past medical, surgical, family and social history reviewed and updated as indicated. Interim medical history since our last visit reviewed. Allergies and medications reviewed and updated.  Review of Systems  Ten systems reviewed and is negative except as mentioned in HPI       Objective:    BP 126/84   Pulse 83   Temp 98 F (36.7 C) (Oral)   Resp 18   Ht 5\' 3"  (1.6 m)   Wt 260 lb 14.4 oz (118.3 kg)   SpO2 99%   BMI 46.22 kg/m   Wt Readings from Last 3 Encounters:  07/17/23 260 lb 14.4 oz (118.3 kg)  01/15/23 270 lb 3.2 oz (122.6 kg)  12/24/22 277 lb 3.2 oz (125.7 kg)    Physical Exam  Constitutional: Patient appears well-developed and well-nourished. Obese  No distress.  HEENT: head atraumatic, normocephalic, pupils equal and reactive to light, ears TMs clear, neck supple, throat within normal limits, facial tenderness Cardiovascular: Normal rate, regular rhythm and normal  heart sounds.  No murmur heard. No BLE edema. Pulmonary/Chest: Effort normal and breath sounds rhonchi. No respiratory distress. Abdominal: Soft.  There is no tenderness. Psychiatric: Patient has a normal mood and affect. behavior is normal. Judgment and thought content normal.  Results for orders placed or performed in visit on 05/08/22  Lipid panel  Result Value Ref Range   Cholesterol 189 <200 mg/dL   HDL 45 (L) > OR = 50 mg/dL   Triglycerides 409 <811 mg/dL   LDL Cholesterol (Calc) 122 (H) mg/dL (calc)   Total CHOL/HDL Ratio 4.2 <5.0 (calc)   Non-HDL Cholesterol (Calc) 144 (H) <130 mg/dL (calc)  CBC with Differential/Platelet  Result Value Ref Range   WBC 7.9 3.8 - 10.8 Thousand/uL   RBC 4.45 3.80 - 5.10 Million/uL   Hemoglobin 12.8 11.7 - 15.5 g/dL   HCT 91.4 78.2 - 95.6 %   MCV 85.8 80.0 - 100.0 fL   MCH 28.8 27.0 - 33.0 pg   MCHC 33.5 32.0 - 36.0 g/dL   RDW 21.3 08.6 - 57.8 %   Platelets 385 140 - 400 Thousand/uL   MPV 9.4 7.5 - 12.5 fL   Neutro Abs 5,198 1,500 - 7,800 cells/uL   Lymphs Abs 1,785 850 - 3,900 cells/uL   Absolute Monocytes 600 200 - 950 cells/uL  Eosinophils Absolute 277 15 - 500 cells/uL   Basophils Absolute 40 0 - 200 cells/uL   Neutrophils Relative % 65.8 %   Total Lymphocyte 22.6 %   Monocytes Relative 7.6 %   Eosinophils Relative 3.5 %   Basophils Relative 0.5 %  COMPLETE METABOLIC PANEL WITH GFR  Result Value Ref Range   Glucose, Bld 93 65 - 99 mg/dL   BUN 16 7 - 25 mg/dL   Creat 4.09 8.11 - 9.14 mg/dL   eGFR 782 > OR = 60 NF/AOZ/3.08M5   BUN/Creatinine Ratio SEE NOTE: 6 - 22 (calc)   Sodium 138 135 - 146 mmol/L   Potassium 4.4 3.5 - 5.3 mmol/L   Chloride 105 98 - 110 mmol/L   CO2 25 20 - 32 mmol/L   Calcium 9.3 8.6 - 10.2 mg/dL   Total Protein 7.2 6.1 - 8.1 g/dL   Albumin 4.0 3.6 - 5.1 g/dL   Globulin 3.2 1.9 - 3.7 g/dL (calc)   AG Ratio 1.3 1.0 - 2.5 (calc)   Total Bilirubin 0.3 0.2 - 1.2 mg/dL   Alkaline phosphatase (APISO) 57 31 -  125 U/L   AST 15 10 - 30 U/L   ALT 11 6 - 29 U/L  Hemoglobin A1c  Result Value Ref Range   Hgb A1c MFr Bld 5.2 <5.7 % of total Hgb   Mean Plasma Glucose 103 mg/dL   eAG (mmol/L) 5.7 mmol/L  Hepatitis C antibody  Result Value Ref Range   Hepatitis C Ab NON-REACTIVE NON-REACTIVE  HIV Antibody (routine testing w rflx)  Result Value Ref Range   HIV 1&2 Ab, 4th Generation NON-REACTIVE NON-REACTIVE  TSH  Result Value Ref Range   TSH 1.40 mIU/L  VITAMIN D 25 Hydroxy (Vit-D Deficiency, Fractures)  Result Value Ref Range   Vit D, 25-Hydroxy 33 30 - 100 ng/mL      Assessment & Plan:   Problem List Items Addressed This Visit   None Visit Diagnoses     Acute recurrent pansinusitis    -  Primary   Recommend taking zyrtec, flonase, mucinex. Push fluids and get rest.    Sending in phenergan-DM, tessalon perls, medrol dose pack and zpac.   Relevant Medications   methylPREDNISolone (MEDROL DOSEPAK) 4 MG TBPK tablet   azithromycin (ZITHROMAX) 250 MG tablet   benzonatate (TESSALON) 100 MG capsule   chlorpheniramine-HYDROcodone (TUSSIONEX) 10-8 MG/5ML   Acute cough       Recommend taking zyrtec, flonase, mucinex. Push fluids and get rest.  Sending in phenergan-DM, tessalon perls, medrol dose pack and zpac.   Relevant Medications   methylPREDNISolone (MEDROL DOSEPAK) 4 MG TBPK tablet   azithromycin (ZITHROMAX) 250 MG tablet   benzonatate (TESSALON) 100 MG capsule   chlorpheniramine-HYDROcodone (TUSSIONEX) 10-8 MG/5ML        Follow up plan: Return if symptoms worsen or fail to improve.

## 2023-07-28 ENCOUNTER — Encounter: Payer: Self-pay | Admitting: Nurse Practitioner

## 2024-06-23 ENCOUNTER — Ambulatory Visit: Payer: Self-pay

## 2024-06-23 NOTE — Telephone Encounter (Signed)
 FYI Only or Action Required?: FYI only for provider.  Patient was last seen in primary care on 07/17/2023 by Gareth Mliss FALCON, FNP.  Called Nurse Triage reporting Sinusitis.  Symptoms began several weeks ago.  Interventions attempted: OTC medications: Sudafed, muccinex nasal spray; prescribed medication: albuterol  inhaler.  Symptoms are: earaches, sinus pain (7-8/10), sore throat, thick green mucus and nasal congestion gradually worsening.  Triage Disposition: See Physician Within 24 Hours  Patient/caregiver understands and will follow disposition?: Yes               Copied from CRM #[id number]. Topic: Clinical - Red Word Triage >> Jun 23, 2024  7:56 AM Misty Lyons wrote: Red Word that prompted transfer to Nurse Triage: Patient states she thinks she has a sinus infection. Has congestion, thick green mucous, and facial pain. Reason for Disposition  Earache  Answer Assessment - Initial Assessment Questions 1. LOCATION: Where does it hurt?      Each side of nose, cheeks.  2. ONSET: When did the sinus pain start?  (e.g., hours, days)      3-4 weeks. Has been treating with Sudafed, Muccinex nasal spray, albuterol  inhaler.  3. SEVERITY: How bad is the pain?   (Scale 0-10; or none, mild, moderate or severe)     7-8/10.  4. RECURRENT SYMPTOM: Have you ever had sinus problems before? If Yes, ask: When was the last time? and What happened that time?      Yes. She states once or twice a year. She has not had one yet this year.  5. NASAL CONGESTION: Is the nose blocked? If Yes, ask: Can you open it or must you breathe through your mouth?     Yes. Stays blocked, unable to open.  6. NASAL DISCHARGE: Do you have discharge from your nose? If so ask, What color?     Yes, thick green mucus.  7. FEVER: Do you have a fever? If Yes, ask: What is it, how was it measured, and when did it start?      No.  8. OTHER SYMPTOMS: Do you have any other symptoms? (e.g.,  sore throat, cough, earache, difficulty breathing)     Sore throat, she states it kept her up last night and she was unable to sleep, earaches. Denies cough, difficulty breathing.  9. PREGNANCY: Is there any chance you are pregnant? When was your last menstrual period?     LMP: 3 weeks ago.  Protocols used: Sinus Pain or Congestion-A-AH

## 2024-06-24 ENCOUNTER — Ambulatory Visit: Payer: Self-pay | Admitting: Nurse Practitioner

## 2024-06-24 ENCOUNTER — Encounter: Payer: Self-pay | Admitting: Nurse Practitioner

## 2024-06-24 VITALS — BP 118/72 | HR 89 | Temp 97.9°F | Resp 16 | Ht 63.0 in | Wt 225.0 lb

## 2024-06-24 DIAGNOSIS — J0141 Acute recurrent pansinusitis: Secondary | ICD-10-CM | POA: Diagnosis not present

## 2024-06-24 MED ORDER — AMOXICILLIN-POT CLAVULANATE 875-125 MG PO TABS: 1.0000 | ORAL_TABLET | Freq: Two times a day (BID) | ORAL | 0 refills | Status: DC

## 2024-06-24 NOTE — Progress Notes (Signed)
 BP 118/72   Pulse 89   Temp 97.9 F (36.6 C)   Resp 16   Ht 5' 3 (1.6 m)   Wt 225 lb (102.1 kg)   SpO2 98%   BMI 39.86 kg/m    Subjective:    Patient ID: Misty Lyons, female    DOB: Aug 27, 1981, 43 y.o.   MRN: [id number]  HPI: Misty Lyons is a 43 y.o. female presenting today with sinus pain and nasal congestion or 3-4 weeks. She also endorses ear pain and sore throat. She denies cough, fever, chest pain, or shortness of breath. She reports thick, green drainage when she is able to blow her nose. She has been taking Sudafed and Mucinex nasal spray for symptom relief and reports minimal improvement.       07/17/2023    2:04 PM 01/15/2023   11:29 AM 12/24/2022    2:19 PM  Depression screen PHQ 2/9  Decreased Interest 0 0 0  Down, Depressed, Hopeless 0 0 0  PHQ - 2 Score 0 0 0    Relevant past medical, surgical, family and social history reviewed and updated as indicated. Interim medical history since our last visit reviewed. Allergies and medications reviewed and updated.  Review of Systems  Ten systems reviewed and is negative except as mentioned in HPI      Objective:     BP 118/72   Pulse 89   Temp 97.9 F (36.6 C)   Resp 16   Ht 5' 3 (1.6 m)   Wt 225 lb (102.1 kg)   SpO2 98%   BMI 39.86 kg/m    Wt Readings from Last 3 Encounters:  06/24/24 225 lb (102.1 kg)  07/17/23 260 lb 14.4 oz (118.3 kg)  01/15/23 270 lb 3.2 oz (122.6 kg)    Physical Exam Constitutional:      Appearance: She is well-developed.  HENT:     Head: Normocephalic and atraumatic.     Right Ear: Tympanic membrane normal.     Left Ear: Tympanic membrane normal.     Nose: No congestion.     Right Sinus: Maxillary sinus tenderness present.     Left Sinus: Maxillary sinus tenderness present.  Eyes:     Conjunctiva/sclera: Conjunctivae normal.  Cardiovascular:     Rate and Rhythm: Normal rate and regular rhythm.     Heart sounds: Normal heart sounds.  Musculoskeletal:     Cervical  back: Normal range of motion and neck supple.  Skin:    General: Skin is warm.  Neurological:     General: No focal deficit present.     Mental Status: She is alert and oriented to person, place, and time.  Psychiatric:        Mood and Affect: Mood normal.        Behavior: Behavior normal.      Results for orders placed or performed in visit on 05/08/22  Lipid panel   Collection Time: 05/08/22  9:56 AM  Result Value Ref Range   Cholesterol 189 <200 mg/dL   HDL 45 (L) > OR = 50 mg/dL   Triglycerides 890 <849 mg/dL   LDL Cholesterol (Calc) 122 (H) mg/dL (calc)   Total CHOL/HDL Ratio 4.2 <5.0 (calc)   Non-HDL Cholesterol (Calc) 144 (H) <130 mg/dL (calc)  CBC with Differential/Platelet   Collection Time: 05/08/22  9:56 AM  Result Value Ref Range   WBC 7.9 3.8 - 10.8 Thousand/uL   RBC 4.45 3.80 - 5.10 Million/uL  Hemoglobin 12.8 11.7 - 15.5 g/dL   HCT 61.7 64.9 - 54.9 %   MCV 85.8 80.0 - 100.0 fL   MCH 28.8 27.0 - 33.0 pg   MCHC 33.5 32.0 - 36.0 g/dL   RDW 87.1 88.9 - 84.9 %   Platelets 385 140 - 400 Thousand/uL   MPV 9.4 7.5 - 12.5 fL   Neutro Abs 5,198 1,500 - 7,800 cells/uL   Lymphs Abs 1,785 850 - 3,900 cells/uL   Absolute Monocytes 600 200 - 950 cells/uL   Eosinophils Absolute 277 15 - 500 cells/uL   Basophils Absolute 40 0 - 200 cells/uL   Neutrophils Relative % 65.8 %   Total Lymphocyte 22.6 %   Monocytes Relative 7.6 %   Eosinophils Relative 3.5 %   Basophils Relative 0.5 %  COMPLETE METABOLIC PANEL WITH GFR   Collection Time: 05/08/22  9:56 AM  Result Value Ref Range   Glucose, Bld 93 65 - 99 mg/dL   BUN 16 7 - 25 mg/dL   Creat 9.23 9.49 - 9.00 mg/dL   eGFR 899 > OR = 60 fO/fpw/8.26f7   BUN/Creatinine Ratio SEE NOTE: 6 - 22 (calc)   Sodium 138 135 - 146 mmol/L   Potassium 4.4 3.5 - 5.3 mmol/L   Chloride 105 98 - 110 mmol/L   CO2 25 20 - 32 mmol/L   Calcium 9.3 8.6 - 10.2 mg/dL   Total Protein 7.2 6.1 - 8.1 g/dL   Albumin 4.0 3.6 - 5.1 g/dL   Globulin  3.2 1.9 - 3.7 g/dL (calc)   AG Ratio 1.3 1.0 - 2.5 (calc)   Total Bilirubin 0.3 0.2 - 1.2 mg/dL   Alkaline phosphatase (APISO) 57 31 - 125 U/L   AST 15 10 - 30 U/L   ALT 11 6 - 29 U/L  Hemoglobin A1c   Collection Time: 05/08/22  9:56 AM  Result Value Ref Range   Hgb A1c MFr Bld 5.2 <5.7 % of total Hgb   Mean Plasma Glucose 103 mg/dL   eAG (mmol/L) 5.7 mmol/L  Hepatitis C antibody   Collection Time: 05/08/22  9:56 AM  Result Value Ref Range   Hepatitis C Ab NON-REACTIVE NON-REACTIVE  HIV Antibody (routine testing w rflx)   Collection Time: 05/08/22  9:56 AM  Result Value Ref Range   HIV 1&2 Ab, 4th Generation NON-REACTIVE NON-REACTIVE  TSH   Collection Time: 05/08/22  9:56 AM  Result Value Ref Range   TSH 1.40 mIU/L  VITAMIN D  25 Hydroxy (Vit-D Deficiency, Fractures)   Collection Time: 05/08/22  9:56 AM  Result Value Ref Range   Vit D, 25-Hydroxy 33 30 - 100 ng/mL          Assessment & Plan:   Problem List Items Addressed This Visit   None Visit Diagnoses       Acute recurrent pansinusitis    -  Primary   Start Augmentin  twice daily for 10 days. Continue with nose spray and saline rinses.   Relevant Medications   amoxicillin -clavulanate (AUGMENTIN ) 875-125 MG tablet        Assessment and Plan -Begin taking Augmentin  875-125 mg daily by mouth 2 times daily for 10 days.  -Advised to continue with mucinex nasal spray. Discussed doing steam showers and drinking warm liquids with honey. -Make sure to get plenty of hydration and rest.  -Advised to return if symptoms do not improve         Follow up plan: Return if symptoms worsen or  fail to improve.    I have reviewed this encounter including the documentation in this note and/or discussed this patient with the provider, Aislinn Womack, SNP, I am certifying that I agree with the content of this note as supervising/preceptor nurse practitioner.  Mliss Spray, FNP-C [facility] 06/24/2024, 3:21 PM

## 2024-07-09 ENCOUNTER — Encounter: Payer: Self-pay | Admitting: Nurse Practitioner

## 2024-10-18 ENCOUNTER — Encounter: Payer: Self-pay | Admitting: Nurse Practitioner

## 2024-10-20 ENCOUNTER — Ambulatory Visit: Payer: Self-pay | Admitting: Nurse Practitioner

## 2024-10-20 VITALS — BP 122/64 | HR 91 | Temp 98.1°F | Resp 18

## 2024-10-20 DIAGNOSIS — R350 Frequency of micturition: Secondary | ICD-10-CM

## 2024-10-20 LAB — POCT URINALYSIS DIPSTICK
Glucose, UA: NEGATIVE
Nitrite, UA: POSITIVE
Odor: NORMAL
Protein, UA: POSITIVE — AB
Spec Grav, UA: 1.02
Urobilinogen, UA: 0.2 U/dL
pH, UA: 6

## 2024-10-20 MED ORDER — CEPHALEXIN 500 MG PO CAPS: 500.0000 mg | ORAL_CAPSULE | Freq: Two times a day (BID) | ORAL | 0 refills | Status: AC

## 2024-10-20 NOTE — Progress Notes (Signed)
 "  BP 122/64   Pulse 91   Temp 98.1 F (36.7 C)   Resp 18   SpO2 99%    Subjective:    Patient ID: Misty Lyons, female    DOB: 01-02-1981, 44 y.o.   MRN: [id number]  HPI: Misty Lyons is a 44 y.o. female  Chief Complaint  Patient presents with   Urinary Tract Infection   Discussed the use of AI scribe software for clinical note transcription with the patient, who gave verbal consent to proceed.  History of Present Illness Misty Lyons is a 44 year old female who presents with urinary symptoms and possible urinary tract infection.  Lower urinary tract symptoms - Frequent urge to urinate for the past few days, possibly up to a week - Sensation of pressure in the lower abdomen - Pain associated with urination, more pronounced on one side - Symptoms worsened after increased physical activity, specifically after walking all day         07/17/2023    2:04 PM 01/15/2023   11:29 AM 12/24/2022    2:19 PM  Depression screen PHQ 2/9  Decreased Interest 0 0 0  Down, Depressed, Hopeless 0 0 0  PHQ - 2 Score 0 0 0    Relevant past medical, surgical, family and social history reviewed and updated as indicated. Interim medical history since our last visit reviewed. Allergies and medications reviewed and updated.  Review of Systems  Ten systems reviewed and is negative except as mentioned in HPI      Objective:      BP 122/64   Pulse 91   Temp 98.1 F (36.7 C)   Resp 18   SpO2 99%    Wt Readings from Last 3 Encounters:  06/24/24 225 lb (102.1 kg)  07/17/23 260 lb 14.4 oz (118.3 kg)  01/15/23 270 lb 3.2 oz (122.6 kg)    Physical Exam VITALS: BP- 124/66 GENERAL: Alert, cooperative, well developed, no acute distress HEENT: Normocephalic, normal oropharynx, moist mucous membranes CHEST: Clear to auscultation bilaterally, No wheezes, rhonchi, or crackles CARDIOVASCULAR: Normal heart rate and rhythm, S1 and S2 normal without murmurs ABDOMEN: Soft, non-tender,  non-distended, without organomegaly, Normal bowel sounds no cva tenderness EXTREMITIES: No cyanosis or edema NEUROLOGICAL: Cranial nerves grossly intact, Moves all extremities without gross motor or sensory deficit  Results for orders placed or performed in visit on 10/20/24  POCT urinalysis dipstick   Collection Time: 10/20/24 11:13 AM  Result Value Ref Range   Color, UA yellow    Clarity, UA clear    Glucose, UA Negative Negative   Bilirubin, UA small    Ketones, UA small    Spec Grav, UA 1.020 1.010 - 1.025   Blood, UA lpositive    pH, UA 6.0 5.0 - 8.0   Protein, UA Positive (A) Negative   Urobilinogen, UA 0.2 0.2 or 1.0 E.U./dL   Nitrite, UA pos    Leukocytes, UA Large (3+) (A) Negative   Appearance clear    Odor normal           Assessment & Plan:   Problem List Items Addressed This Visit   None Visit Diagnoses       Urinary frequency    -  Primary   Relevant Medications   cephALEXin  (KEFLEX ) 500 MG capsule   Other Relevant Orders   POCT urinalysis dipstick (Completed)   Urine Culture        Assessment and Plan Assessment & Plan  Acute urinary tract infection Symptoms of dysuria, pressure, and unilateral pain for approximately one week. Urine dipstick test shows positive leukocytes, nitrates, protein, large blood, and ketones, indicating a likely bacterial infection. Previous episodes have resolved spontaneously, but this episode persists. - Prescribed Keflex  for broad-spectrum coverage. - Encouraged increased fluid intake. - Ordered urine culture to confirm diagnosis and guide further treatment.        Follow up plan: Return if symptoms worsen or fail to improve. "

## 2024-10-22 ENCOUNTER — Ambulatory Visit: Payer: Self-pay | Admitting: Nurse Practitioner

## 2024-10-22 LAB — URINE CULTURE
MICRO NUMBER:: 17550254
SPECIMEN QUALITY:: ADEQUATE
